# Patient Record
Sex: Female | Born: 1935 | Race: White | Hispanic: No | Smoking: Never smoker
Health system: Southern US, Community
[De-identification: ages and names within clinical notes are randomized; demographics above are authoritative.]

## PROBLEM LIST (undated history)

## (undated) DIAGNOSIS — E785 Hyperlipidemia, unspecified: Secondary | ICD-10-CM

## (undated) DIAGNOSIS — K219 Gastro-esophageal reflux disease without esophagitis: Secondary | ICD-10-CM

## (undated) DIAGNOSIS — I10 Essential (primary) hypertension: Secondary | ICD-10-CM

## (undated) DIAGNOSIS — K222 Esophageal obstruction: Secondary | ICD-10-CM

## (undated) HISTORY — DX: Essential (primary) hypertension: I10

## (undated) HISTORY — PX: COLONOSCOPY: SHX174

## (undated) HISTORY — DX: Gastro-esophageal reflux disease without esophagitis: K21.9

## (undated) HISTORY — DX: Esophageal obstruction: K22.2

## (undated) HISTORY — PX: POLYPECTOMY: SHX149

## (undated) HISTORY — PX: VAGINAL HYSTERECTOMY: SUR661

## (undated) HISTORY — DX: Hyperlipidemia, unspecified: E78.5

---

## 2005-05-01 ENCOUNTER — Ambulatory Visit: Payer: Self-pay | Admitting: Internal Medicine

## 2005-05-15 ENCOUNTER — Ambulatory Visit: Payer: Self-pay | Admitting: Internal Medicine

## 2011-04-18 ENCOUNTER — Ambulatory Visit (AMBULATORY_SURGERY_CENTER): Payer: Medicare Other | Admitting: *Deleted

## 2011-04-18 VITALS — Ht 66.0 in | Wt 144.0 lb

## 2011-04-18 DIAGNOSIS — Z860100 Personal history of colon polyps, unspecified: Secondary | ICD-10-CM

## 2011-04-18 DIAGNOSIS — Z1211 Encounter for screening for malignant neoplasm of colon: Secondary | ICD-10-CM

## 2011-04-18 MED ORDER — SUPREP BOWEL PREP KIT 17.5-3.13-1.6 GM/177ML PO SOLN: 1.0000 | ORAL | Status: DC

## 2011-04-24 ENCOUNTER — Telehealth: Payer: Self-pay | Admitting: Internal Medicine

## 2011-04-24 NOTE — Telephone Encounter (Signed)
Rx for Suprep called in to [facility] on [address]  Pt notified. Ezra Sites

## 2011-05-09 ENCOUNTER — Ambulatory Visit (AMBULATORY_SURGERY_CENTER): Payer: Medicare Other | Admitting: Internal Medicine

## 2011-05-09 ENCOUNTER — Encounter: Payer: Self-pay | Admitting: Internal Medicine

## 2011-05-09 VITALS — BP 127/67 | HR 72 | Temp 98.1°F | Resp 18 | Ht 66.0 in | Wt 144.0 lb

## 2011-05-09 DIAGNOSIS — Z860100 Personal history of colon polyps, unspecified: Secondary | ICD-10-CM

## 2011-05-09 DIAGNOSIS — Z1211 Encounter for screening for malignant neoplasm of colon: Secondary | ICD-10-CM

## 2011-05-09 MED ORDER — SODIUM CHLORIDE 0.9 % IV SOLN: 500.0000 mL | INTRAVENOUS | Status: DC

## 2011-05-09 NOTE — Patient Instructions (Signed)
Discharge instructions given with verbal understanding. Normal examination. Resume previous medications. 

## 2011-05-10 ENCOUNTER — Telehealth: Payer: Self-pay | Admitting: *Deleted

## 2011-05-10 NOTE — Telephone Encounter (Signed)

## 2014-05-18 ENCOUNTER — Telehealth: Payer: Self-pay

## 2014-05-18 NOTE — Telephone Encounter (Signed)
Pt scheduled for previsit 06/08/14@1 :30pm, EGD with dil scheduled in the [facility] 06/17/14@4pm . Pt aware of appts.

## 2014-05-18 NOTE — Telephone Encounter (Signed)
Message copied by Chrystie NoseHUNT, Evelyn Moch R on Tue May 18, 2014  3:18 PM ------      Message from: Hilarie FredricksonPERRY, JOHN N      Created: Tue May 18, 2014  1:34 PM      Regarding: [facility]       Emry Barbato,            I spoke with Tonya Fischer last evening regarding a several year history of intermittent solid food dysphagia. She is my patient. I am good friends of her sons, both physicians. Please set her up for EGD with dilation in [facility] as we discussed. Her home number is [phone]. Cell phone [phone]. Thanks            Dr. Marina GoodellPerry ------

## 2014-06-07 ENCOUNTER — Ambulatory Visit (AMBULATORY_SURGERY_CENTER): Payer: Self-pay | Admitting: *Deleted

## 2014-06-07 VITALS — Ht 64.0 in | Wt 143.5 lb

## 2014-06-07 DIAGNOSIS — R1310 Dysphagia, unspecified: Secondary | ICD-10-CM

## 2014-06-07 NOTE — Progress Notes (Signed)
No egg or soy allergy. ewm No home 02 use. ewm No diet pills. ewm No problems with past sedation. ewm  

## 2014-06-17 ENCOUNTER — Encounter: Payer: Self-pay | Admitting: *Deleted

## 2014-06-17 ENCOUNTER — Encounter: Payer: Self-pay | Admitting: Internal Medicine

## 2014-06-17 ENCOUNTER — Ambulatory Visit (AMBULATORY_SURGERY_CENTER): Payer: Medicare Other | Admitting: Internal Medicine

## 2014-06-17 VITALS — BP 123/74 | HR 73 | Temp 97.2°F | Resp 16 | Ht 64.0 in | Wt 143.0 lb

## 2014-06-17 DIAGNOSIS — R1314 Dysphagia, pharyngoesophageal phase: Secondary | ICD-10-CM

## 2014-06-17 DIAGNOSIS — K2100 Gastro-esophageal reflux disease with esophagitis, without bleeding: Secondary | ICD-10-CM

## 2014-06-17 DIAGNOSIS — K222 Esophageal obstruction: Secondary | ICD-10-CM

## 2014-06-17 MED ORDER — OMEPRAZOLE 20 MG PO CPDR: DELAYED_RELEASE_CAPSULE | ORAL | Status: DC

## 2014-06-17 MED ORDER — SODIUM CHLORIDE 0.9 % IV SOLN: 500.0000 mL | INTRAVENOUS | Status: DC

## 2014-06-17 NOTE — Progress Notes (Signed)
Called to room to assist during endoscopic procedure.  Patient ID and intended procedure confirmed with present staff. Received instructions for my participation in the procedure from the performing physician.  

## 2014-06-17 NOTE — Patient Instructions (Signed)
Discharge instructions given. Handout on a dilatation diet. Prescription sent in to pharmacy. Call office next 2-3 days to schedule an office appointment with Dr. Marina GoodellPerry for 6-8 weeks. YOU HAD AN ENDOSCOPIC PROCEDURE TODAY AT THE [facility]: Refer to the procedure report that was given to you for any specific questions about what was found during the examination.  If the procedure report does not answer your questions, please call your gastroenterologist to clarify.  If you requested that your care partner not be given the details of your procedure findings, then the procedure report has been included in a sealed envelope for you to review at your convenience later.  YOU SHOULD EXPECT: Some feelings of bloating in the abdomen. Passage of more gas than usual.  Walking can help get rid of the air that was put into your GI tract during the procedure and reduce the bloating. If you had a lower endoscopy (such as a colonoscopy or flexible sigmoidoscopy) you may notice spotting of blood in your stool or on the toilet paper. If you underwent a bowel prep for your procedure, then you may not have a normal bowel movement for a few days.  DIET: Your first meal following the procedure should be a light meal and then it is ok to progress to your normal diet.  A half-sandwich or bowl of soup is an example of a good first meal.  Heavy or fried foods are harder to digest and may make you feel nauseous or bloated.  Likewise meals heavy in dairy and vegetables can cause extra gas to form and this can also increase the bloating.  Drink plenty of fluids but you should avoid alcoholic beverages for 24 hours.  ACTIVITY: Your care partner should take you home directly after the procedure.  You should plan to take it easy, moving slowly for the rest of the day.  You can resume normal activity the day after the procedure however you should NOT DRIVE or use heavy machinery for 24 hours (because of the sedation  medicines used during the test).    SYMPTOMS TO REPORT IMMEDIATELY: A gastroenterologist can be reached at any hour.  During normal business hours, 8:30 AM to 5:00 PM Monday through Friday, call [phone].  After hours and on weekends, please call the GI answering service at [phone] who will take a message and have the physician on call contact you.    Following upper endoscopy (EGD)  Vomiting of blood or coffee ground material  New chest pain or pain under the shoulder blades  Painful or persistently difficult swallowing  New shortness of breath  Fever of 100F or higher  Black, tarry-looking stools  FOLLOW UP: If any biopsies were taken you will be contacted by phone or by letter within the next 1-3 weeks.  Call your gastroenterologist if you have not heard about the biopsies in 3 weeks.  Our staff will call the home number listed on your records the next business day following your procedure to check on you and address any questions or concerns that you may have at that time regarding the information given to you following your procedure. This is a courtesy call and so if there is no answer at the home number and we have not heard from you through the emergency physician on call, we will assume that you have returned to your regular daily activities without incident.  SIGNATURES/CONFIDENTIALITY: You and/or your care partner have signed paperwork which will be entered into  your electronic medical record.  These signatures attest to the fact that that the information above on your After Visit Summary has been reviewed and is understood.  Full responsibility of the confidentiality of this discharge information lies with you and/or your care-partner.

## 2014-06-17 NOTE — Progress Notes (Signed)
Report to PACU, RN, vss, BBS= Clear.  

## 2014-06-17 NOTE — Op Note (Signed)
[facility] [address] [place], [id number]   55ENDOSCOPY PROCEDURE REPORT  PATIENT: [name], Tonya F  MR#: [id number] BIRTHDATE: 16-Aug-1935 , 79  yrs. old GENDER: female ENDOSCOPIST: Roxy CedarJohn N Perry Jr, MD REFERRED BY:  .Direct Self PROCEDURE DATE:  06/17/2014 PROCEDURE:  EGD, diagnostic and Maloney dilation of esophagus   ?"552 [place] ASA CLASS:     Class II INDICATIONS:  dysphagia. - intermittent solid food dysphagia MEDICATIONS: Monitored anesthesia care and Propofol 160 mg IV TOPICAL ANESTHETIC: Cetacaine Spray  DESCRIPTION OF PROCEDURE: After the risks benefits and alternatives of the procedure were thoroughly explained, informed consent was obtained.  The LB [id number] endoscope was introduced through the mouth and advanced to the second portion of the duodenum , Without limitations.  The instrument was slowly withdrawn as the mucosa was fully examined.   EXAM:The esophagus revealed a significant ringlike peptic stricture with surrounding shelf at the gastroesophageal junction (37 cm from the teeth) measuring approximately 10 mm.  There was mild inflammation present.  No Barrett's esophagus.  The stomach revealed a 4 cm sliding hiatal hernia but was otherwise normal. The duodenum was normal to the second portion.  Retroflexed views revealed a hiatal hernia.     The scope was then withdrawn from the patient and the procedure completed. THERAPY: 352 French Maloney dilator was passed into the esophagus without resistance or heme. Tolerated well  COMPLICATIONS: There were no immediate complications.  ENDOSCOPIC IMPRESSION: 1. GERD complicated by peptic stricture status post [facility] dilation?"8452 [place]  RECOMMENDATIONS: 1.  Clear liquids until 6 PM, then soft foods rest of day.  Resume prior diet tomorrow. 2.  Prescribe omeprazole 20 mg daily; #30; 11 refills 3.  Call office next 2-3 days to schedule an office appointment with Dr. Marina GoodellPerry for 6-8  weeks  REPEAT EXAM:  eSigned:  Roxy CedarJohn N Perry Jr, MD 06/17/2014 4:00 PM    FA:OZHYQCC:Edwin Chilton SiGreen, MD and The Patient

## 2014-06-18 ENCOUNTER — Telehealth: Payer: Self-pay | Admitting: *Deleted

## 2014-06-18 NOTE — Telephone Encounter (Signed)
  Follow up Call-  Call back number 06/17/2014  Post procedure Call Back phone  # [phone]  Permission to leave phone message Yes     Patient questions:  Do you have a fever, pain , or abdominal swelling? No. Pain Score  0 *  Have you tolerated food without any problems? Yes.    Have you been able to return to your normal activities? Yes.    Do you have any questions about your discharge instructions: Diet   No. Medications  No. Follow up visit  No.  Do you have questions or concerns about your Care? No.  Actions: * If pain score is 4 or above: No action needed, pain <4.

## 2014-08-23 ENCOUNTER — Encounter: Payer: Self-pay | Admitting: Internal Medicine

## 2014-08-23 ENCOUNTER — Ambulatory Visit (INDEPENDENT_AMBULATORY_CARE_PROVIDER_SITE_OTHER): Payer: Medicare Other | Admitting: Internal Medicine

## 2014-08-23 VITALS — BP 120/74 | HR 80 | Ht 64.0 in | Wt 148.6 lb

## 2014-08-23 DIAGNOSIS — K222 Esophageal obstruction: Secondary | ICD-10-CM

## 2014-08-23 DIAGNOSIS — K219 Gastro-esophageal reflux disease without esophagitis: Secondary | ICD-10-CM

## 2014-08-23 DIAGNOSIS — R1310 Dysphagia, unspecified: Secondary | ICD-10-CM

## 2014-08-23 NOTE — Progress Notes (Signed)
HISTORY OF PRESENT ILLNESS:  Tonya Fischer is a 80 y.o. female with hypertension, hyperlipidemia, and chronic GERD. She was recently evaluated for intermittent solid food dysphagia and underwent upper endoscopy 06/17/2014. She was found to have a peptic stricture of the distal esophagus which was dilated to 83 [place]. Also, a sliding hiatal hernia. Post dilation she was prescribed omeprazole 20 mg daily. She presents today for follow-up. Since her endoscopy she has had absolutely no complaints. No reflux symptoms. No recurrent dysphagia. No appreciable medication side effect  REVIEW OF SYSTEMS:  All non-GI ROS negative except for rash on lower extremities  Past Medical History  Diagnosis Date  . Hypertension   . Hyperlipidemia   . GERD (gastroesophageal reflux disease)   . Peptic stricture of esophagus     Past Surgical History  Procedure Laterality Date  . Vaginal hysterectomy      1984  . Colonoscopy    . Polypectomy      Social History ELYZABETH Fischer  reports that she has never smoked. She has never used smokeless tobacco. She reports that she drinks alcohol. She reports that she does not use illicit drugs.  family history includes Stomach cancer in her paternal uncle; Stroke in her mother. There is no history of Colon cancer or Esophageal cancer.  No Known Allergies     PHYSICAL EXAMINATION: Vital signs: BP 120/74 mmHg  Pulse 80  Ht  (1.626 m)  Wt 148 lb 9.6 oz (67.405 kg)  BMI 25.49 kg/m2 General: Well-developed, well-nourished, no acute distress Abdomen: Not reexamined Extremities: No edema. Flat erythematous diffuse rash on the shin surface bilaterally Psychiatric: alert and oriented x3. Cooperative   ASSESSMENT:  #1. GERD complicated by peptic stricture. Asymptomatic post dilation on PPI. I reviewed with her in detail the endoscopic findings, the pathophysiology, and treatment plan. As well, expectations moving forward regarding her condition #2. History of  adenomatous colon polyps. Last colonoscopy September 2012 without neoplasia.   PLAN:   #1. Reflux precautions #2. Continue PPI #3. Routine follow-up one year. Sooner if needed for recurrent symptoms or other issues

## 2014-08-23 NOTE — Patient Instructions (Signed)
Please follow up with Dr. Perry in one year 

## 2015-01-24 ENCOUNTER — Telehealth: Payer: Self-pay | Admitting: Internal Medicine

## 2015-01-24 NOTE — Telephone Encounter (Signed)
Discussed with pt that she can hold the citrucel if she is having diarrhea. Pt verbalized understanding.

## 2015-03-02 ENCOUNTER — Other Ambulatory Visit (HOSPITAL_COMMUNITY): Payer: Self-pay | Admitting: Internal Medicine

## 2015-03-02 DIAGNOSIS — I4891 Unspecified atrial fibrillation: Secondary | ICD-10-CM

## 2015-03-07 ENCOUNTER — Ambulatory Visit (HOSPITAL_COMMUNITY)
Admission: RE | Admit: 2015-03-07 | Discharge: 2015-03-07 | Disposition: A | Discharge status: Home / Self Care | Payer: Medicare Other | Source: Ambulatory Visit | Attending: Internal Medicine | Admitting: Internal Medicine

## 2015-03-07 DIAGNOSIS — I358 Other nonrheumatic aortic valve disorders: Secondary | ICD-10-CM | POA: Insufficient documentation

## 2015-03-07 DIAGNOSIS — I4891 Unspecified atrial fibrillation: Secondary | ICD-10-CM

## 2015-03-07 DIAGNOSIS — I071 Rheumatic tricuspid insufficiency: Secondary | ICD-10-CM | POA: Insufficient documentation

## 2015-03-07 NOTE — Progress Notes (Signed)
  Echocardiogram 2D Echocardiogram has been performed.  Arvil ChacoFoster, Jalesia Loudenslager 03/07/2015, 1:23 PM

## 2015-04-18 ENCOUNTER — Encounter: Payer: Self-pay | Admitting: Internal Medicine

## 2015-04-18 ENCOUNTER — Ambulatory Visit (INDEPENDENT_AMBULATORY_CARE_PROVIDER_SITE_OTHER): Payer: Medicare Other | Admitting: Internal Medicine

## 2015-04-18 VITALS — BP 118/72 | HR 80 | Ht 65.0 in | Wt 143.5 lb

## 2015-04-18 DIAGNOSIS — R002 Palpitations: Secondary | ICD-10-CM | POA: Diagnosis not present

## 2015-04-18 NOTE — Progress Notes (Addendum)
Cardiology Office Note   Date:  04/18/2015   ID:  Tonya Fischer, DOB Aug 12, 1935, MRN [id number]  PCP:  Tonya Sack, MD  Cardiologist:   Tonya Pates, MD   No chief complaint on file.  Pt referred for palpitations     History of Present Illness: Tonya Fischer is a 80 y.o. female with a history of HTN,  HL and  esophageal stricture   Pt followed by Dr Tonya Fischer  Recently seenin clinic  EKG was concerning to him  He reviewed with her   No palpitations  No fluttering  Breathing is good  No CP  Pt says she is  active    Current Outpatient Prescriptions  Medication Sig Dispense Refill  . amLODipine (NORVASC) 5 MG tablet Take 5 mg by mouth daily.      Marland Kitchen aspirin 81 MG tablet Take 81 mg by mouth daily. 1-2 daily    . losartan-hydrochlorothiazide (HYZAAR) 100-25 MG per tablet Take 1 tablet by mouth daily.      . Multiple Vitamins-Minerals (MULTIVITAMIN WITH MINERALS) tablet Take 1 tablet by mouth daily.      Marland Kitchen omeprazole (PRILOSEC) 20 MG capsule Take 1 tablet 30 minutes before breakfast 30 capsule 11  . simvastatin (ZOCOR) 10 MG tablet TK 1 T PO  D  0   No current facility-administered medications for this visit.    Allergies:   Review of patient's allergies indicates no known allergies.   Past Medical History  Diagnosis Date  . Hypertension   . Hyperlipidemia   . GERD (gastroesophageal reflux disease)   . Peptic stricture of esophagus     Past Surgical History  Procedure Laterality Date  . Vaginal hysterectomy      1984  . Colonoscopy    . Polypectomy       Social History:  The patient  reports that she has never smoked. She has never used smokeless tobacco. She reports that she drinks alcohol. She reports that she does not use illicit drugs.   Family History:  The patient's family history includes Stomach cancer in her paternal uncle; Stroke in her mother. There is no history of Colon cancer or Esophageal cancer.    ROS:  Please see the history of present illness.  All other systems are reviewed and  Negative to the above problem except as noted.    PHYSICAL EXAM: VS:  BP 118/72 mmHg  Pulse 80  Ht  (1.651 m)  Wt 143 lb 8 oz (65.091 kg)  BMI 23.88 kg/m2  GEN: Well nourished, well developed, in no acute distress HEENT: normal Neck: no JVD, carotid bruits, or masses Cardiac: RRR; no murmurs, rubs, or gallops,no edema  Respiratory:  clear to auscultation bilaterally, normal work of breathing GI: soft, nontender, nondistended, + BS  No hepatomegaly  MS: no deformity Moving all extremities   Skin: warm and dry, no rash Neuro:  Strength and sensation are intact Psych: euthymic mood, full affect   EKG:  EKG is ordered today. SR 80 bpm     Lipid Panel No results found for: CHOL, TRIG, HDL, CHOLHDL, VLDL, LDLCALC, LDLDIRECT    Wt Readings from Last 3 Encounters:  04/18/15 143 lb 8 oz (65.091 kg)  08/23/14 148 lb 9.6 oz (67.405 kg)  06/17/14 143 lb (64.864 kg)      ASSESSMENT AND PLAN:  1.  Palpitations.  Pt is asymptomatic  I do not have EKG from Dr Tonya Fischer office  Will request it.  HR is irreg at times today during exam  Tele strip with a few very short bursts PAT Will set up for 48 hour holter monitor . Keep on same medicines for now CHeck on TSH  2.  HL  Continue statin  3.  HTN  Adequate control     Signed, Tonya Pates, MD  04/18/2015 12:13 PM    [facility] [address] [place], [place], [place]  [address] Phone: [phone]; Fax: [phone]

## 2015-04-18 NOTE — Patient Instructions (Signed)
Your physician recommends that you schedule a follow-up appointment in: to be determined by Dr Tenny Craw after monitor.     Your physician recommends that you continue on your current medications as directed. Please refer to the Current Medication list given to you today.      Your physician has recommended that you wear an event monitor for 48 hrs. Event monitors are medical devices that record the heart's electrical activity. Doctors most often [place] these monitors to diagnose arrhythmias. Arrhythmias are problems with the speed or rhythm of the heartbeat. The monitor is a small, portable device. You can wear one while you do your normal daily activities. This is usually used to diagnose what is causing palpitations/syncope (passing out).     Thank you for choosing [facility] !

## 2015-04-22 ENCOUNTER — Ambulatory Visit (INDEPENDENT_AMBULATORY_CARE_PROVIDER_SITE_OTHER): Payer: Medicare Other

## 2015-04-22 DIAGNOSIS — R002 Palpitations: Secondary | ICD-10-CM

## 2015-04-27 ENCOUNTER — Telehealth: Payer: Self-pay | Admitting: *Deleted

## 2015-04-27 NOTE — Telephone Encounter (Signed)
I received called report from [facility] that pt had 2 episodes of VT on recent holter monitor, pt symptomatic.   Dr Patty Sermons reviewed complete Holter Monitor 48 hour done 04/22/15: 48 hour Holter monitor shows sinus rhythm. Occasional PVCs are seen.  Occasional paired PVCs and one run of nonsustained VT of 8 beats, asymptomatic. In addition there are occasional PACs and occassional short runs of SVT.  No atrial fibrillation in seen.    I spoke with pt- pt states she has not had any symptoms, specifically she did not have any symptoms while she had monitor.   The 48 hour Holter monitor report has been forwarded to Dr Tenny Craw for review.

## 2015-05-06 ENCOUNTER — Telehealth: Payer: Self-pay | Admitting: Internal Medicine

## 2015-05-06 NOTE — Telephone Encounter (Signed)
Left msg on voicemail that I called  I have reviewed holter monitor with EP   Short burst of tachycardia   Pt asymtpomatic   No atrial fibrillation noted  She did have echo done previously which was normal She was not having any symptoms done when I saw her in clinic   If she senses palpitations could try low dose b blocker (Toprol XL 25)  Othrewise keep on same meds   Would try to get EKG done from Internal med  Otherwise set to se in clinic after new year.

## 2015-05-08 NOTE — Telephone Encounter (Signed)
Spoke with pt  She called back Feeling good  No palpitations Continue current meds   Stay active   WIll f/u in Spring

## 2015-08-31 ENCOUNTER — Encounter: Payer: Self-pay | Admitting: Internal Medicine

## 2016-04-06 ENCOUNTER — Encounter: Payer: Self-pay | Admitting: Internal Medicine

## 2016-05-04 ENCOUNTER — Telehealth: Payer: Self-pay | Admitting: Internal Medicine

## 2016-05-04 NOTE — Telephone Encounter (Signed)
Pt called and states she did not have any polyps on her 2012 colonoscopy. Pt received her recall letter and does not want to schedule a repeat colon. Dr. Marina GoodellPerry notified.

## 2016-05-06 NOTE — Telephone Encounter (Signed)
She is correct (and the mom of physician friends of mine). She does NOT require routine follow up based on previous exams and current guidelines. Please let her know. Thanks

## 2016-05-07 NOTE — Telephone Encounter (Signed)
Pt aware.

## 2018-04-02 ENCOUNTER — Telehealth: Payer: Self-pay | Admitting: Internal Medicine

## 2018-04-02 NOTE — Telephone Encounter (Signed)
Pt calling with constipation and wanted to know what oral powder laxative Dr. Marina GoodellPerry suggests. Discussed with pt that he recommends OTC miralax. Pt verbalized that the stool was quite painful to pass also, suggested a stool softener for the hard stool. Pt verbalized understanding.

## 2018-04-02 NOTE — Telephone Encounter (Signed)
Pt wants to know what Dr. Marina GoodellPerry recommends for constipation, she was thinking about taking miralax but wants to check with him first.

## 2019-09-11 ENCOUNTER — Ambulatory Visit: Payer: Medicare Other | Attending: Internal Medicine

## 2019-09-11 DIAGNOSIS — Z23 Encounter for immunization: Secondary | ICD-10-CM | POA: Insufficient documentation

## 2019-09-11 NOTE — Progress Notes (Signed)
   Covid-19 Vaccination Clinic  Name:  Tonya Fischer    MRN: [id number] DOB: 12-02-1934  09/11/2019  [name] was observed post Covid-19 immunization for 15 minutes without incidence. She was provided with Vaccine Information Sheet and instruction to access the V-Safe system.   [name] was instructed to call 911 with any severe reactions post vaccine: Marland Kitchen Difficulty breathing  . Swelling of your face and throat  . A fast heartbeat  . A bad rash all over your body  . Dizziness and weakness    Immunizations Administered    Name Date Dose VIS Date Route   Pfizer COVID-19 Vaccine 09/11/2019  1:46 PM 0.3 mL 07/31/2019 Intramuscular   Manufacturer: [employer], [employer]   Lot: [id number]   NDC: [address]-3

## 2019-09-30 ENCOUNTER — Ambulatory Visit: Payer: Medicare Other | Attending: Internal Medicine

## 2019-09-30 DIAGNOSIS — Z23 Encounter for immunization: Secondary | ICD-10-CM | POA: Insufficient documentation

## 2019-09-30 NOTE — Progress Notes (Signed)
   Covid-19 Vaccination Clinic  Name:  Tonya Fischer    MRN: [id number] DOB: Oct 31, 1934  09/30/2019  [name] was observed post Covid-19 immunization for 15 minutes without incidence. She was provided with Vaccine Information Sheet and instruction to access the V-Safe system.   [name] was instructed to call 911 with any severe reactions post vaccine: Marland Kitchen Difficulty breathing  . Swelling of your face and throat  . A fast heartbeat  . A bad rash all over your body  . Dizziness and weakness    Immunizations Administered    Name Date Dose VIS Date Route   Pfizer COVID-19 Vaccine 09/30/2019  1:44 PM 0.3 mL 07/31/2019 Intramuscular   Manufacturer: [employer], [employer]   Lot: [id number]   NDC: [address]-0

## 2020-01-28 ENCOUNTER — Telehealth: Payer: Self-pay

## 2020-01-28 NOTE — Telephone Encounter (Signed)
Pt scheduled to see Dr. Marina Goodell 02/02/20@2pm . Left message for pt to call back.

## 2020-01-28 NOTE — Telephone Encounter (Signed)
Pt returned your call and was informed of appt with Dr. Marina Goodell. She agreed with date and time.

## 2020-01-28 NOTE — Telephone Encounter (Signed)
-----   Message from Hilarie Fredrickson, MD sent at 01/28/2020 10:16 AM EDT ----- Regarding: Appointment Tuesday, June 15 at 2 PM Jozlyn Schatz,Please arrange an appointment for Mrs. Baltes this coming Tuesday, June 15 at 2 PM.  Please call her and let her know.  Thank youJ.

## 2020-02-02 ENCOUNTER — Ambulatory Visit (INDEPENDENT_AMBULATORY_CARE_PROVIDER_SITE_OTHER): Payer: Medicare Other | Admitting: Internal Medicine

## 2020-02-02 ENCOUNTER — Encounter: Payer: Self-pay | Admitting: Internal Medicine

## 2020-02-02 ENCOUNTER — Other Ambulatory Visit (INDEPENDENT_AMBULATORY_CARE_PROVIDER_SITE_OTHER): Payer: Medicare Other

## 2020-02-02 ENCOUNTER — Other Ambulatory Visit: Payer: Self-pay

## 2020-02-02 VITALS — BP 120/70 | HR 84 | Ht 65.0 in | Wt 130.0 lb

## 2020-02-02 DIAGNOSIS — R198 Other specified symptoms and signs involving the digestive system and abdomen: Secondary | ICD-10-CM

## 2020-02-02 DIAGNOSIS — K589 Irritable bowel syndrome without diarrhea: Secondary | ICD-10-CM

## 2020-02-02 DIAGNOSIS — K219 Gastro-esophageal reflux disease without esophagitis: Secondary | ICD-10-CM | POA: Diagnosis not present

## 2020-02-02 DIAGNOSIS — K222 Esophageal obstruction: Secondary | ICD-10-CM

## 2020-02-02 LAB — HEPATIC FUNCTION PANEL
ALT: 20 U/L (ref 0–35)
AST: 25 U/L (ref 0–37)
Albumin: 4.6 g/dL (ref 3.5–5.2)
Alkaline Phosphatase: 120 U/L — ABNORMAL HIGH (ref 39–117)
Bilirubin, Direct: 0.1 mg/dL (ref 0.0–0.3)
Total Bilirubin: 0.6 mg/dL (ref 0.2–1.2)
Total Protein: 8.3 g/dL (ref 6.0–8.3)

## 2020-02-02 LAB — BASIC METABOLIC PANEL
BUN: 7 mg/dL (ref 6–23)
CO2: 29 mEq/L (ref 19–32)
Calcium: 10.1 mg/dL (ref 8.4–10.5)
Chloride: 94 mEq/L — ABNORMAL LOW (ref 96–112)
Creatinine, Ser: 0.67 mg/dL (ref 0.40–1.20)
GFR: 84.05 mL/min (ref >60.00)
Glucose, Bld: 106 mg/dL — ABNORMAL HIGH (ref 70–99)
Potassium: 3.5 mEq/L (ref 3.5–5.1)
Sodium: 129 mEq/L — ABNORMAL LOW (ref 135–145)

## 2020-02-02 LAB — CBC WITH DIFFERENTIAL/PLATELET
Basophils Absolute: 0.1 10*3/uL (ref 0.0–0.1)
Basophils Relative: 0.6 % (ref 0.0–3.0)
Eosinophils Absolute: 0 10*3/uL (ref 0.0–0.7)
Eosinophils Relative: 0.2 % (ref 0.0–5.0)
HCT: 37.7 % (ref 36.0–46.0)
Hemoglobin: 12.6 g/dL (ref 12.0–15.0)
Lymphocytes Relative: 37.3 % (ref 12.0–46.0)
Lymphs Abs: 3.3 10*3/uL (ref 0.7–4.0)
MCHC: 33.5 g/dL (ref 30.0–36.0)
MCV: 82.1 fl (ref 78.0–100.0)
Monocytes Absolute: 0.9 10*3/uL (ref 0.1–1.0)
Monocytes Relative: 9.7 % (ref 3.0–12.0)
Neutro Abs: 4.7 10*3/uL (ref 1.4–7.7)
Neutrophils Relative %: 52.2 % (ref 43.0–77.0)
Platelets: 271 10*3/uL (ref 150.0–400.0)
RBC: 4.59 Mil/uL (ref 3.87–5.11)
RDW: 13.4 % (ref 11.5–15.5)
WBC: 9 10*3/uL (ref 4.0–10.5)

## 2020-02-02 LAB — TSH: TSH: 0.99 u[IU]/mL (ref 0.35–4.50)

## 2020-02-02 NOTE — Patient Instructions (Signed)
Your provider has requested that you go to the basement level for lab work before leaving today. Press "B" on the elevator. The lab is located at the first door on the left as you exit the elevator.    Follow the instructions on the Hemoccult cards and mail them back to [place] when you are finished or you may take them directly to the lab in the basement of the [facility] building. We will call you with the results.

## 2020-02-02 NOTE — Progress Notes (Signed)
HISTORY OF PRESENT ILLNESS:  Tonya Fischer is a 85 y.o. female, mother of Drs. Tonya Fischer and Tonya Fischer, with hypertension, hyperlipidemia, and GERD complicated by peptic stricture for which she underwent upper endoscopy with esophageal dilation in October 2016.  She presents today for evaluation of chronic intermittent problems with irregular bowel habits associated with decreased energy times.  She was last seen in this office January 2016.  Her primary care physician, Tonya Fischer, has retired.  Patient is planning on establishing with Tonya Fischer later this year.  She is contacted intermittently mildly regularities.  More recently complaining of being "less chipper" with decreased energy associated with frequent bowel movements.  She has had approximately 8 pound weight loss since last year.  Tells me that she takes 2 tablespoons of Citrucel daily as well as stool softeners.  With this regimen she has a bowel movement once every 2 to 3 days where 2 or 3 bowel movements per day.  She denies incontinence, abdominal discomfort, nausea, or bleeding.  She denies bloating.  She will notice that when she has several bowel movements (which are not diarrheal) that she may feel less energetic thereafter.  She is often tempted to take a half a dose of Imodium.  She has had multiple previous colonoscopies. Examination 2003 with diminutive adenoma. Examination of 2006 was negative for neoplasia.    Examination 2012 was normal and negative for neoplasia.  She has aged out of surveillance.  Also has a history of GERD and peptic stricture.  Currently not on PPI therapy.  She has had no recurrent dysphagia since her remote dilation.  She is not requiring PPI therapy she is asymptomatic.  Last upper endoscopy with dilation was October 2015 she did bring with her blood work from her office evaluation April 09, 2019.  Hemoglobin 13.3.  Liver tests normal.  No family history of colon cancer.  Her sister passed away from ovarian  cancer.  Patient status post vaginal hysterectomy.  She has been vaccinated against Covid.  REVIEW OF SYSTEMS:  All non-GI ROS negative unless otherwise stated in the HPI.  Past Medical History:  Diagnosis Date  . GERD (gastroesophageal reflux disease)   . Hyperlipidemia   . Hypertension   . Peptic stricture of esophagus     Past Surgical History:  Procedure Laterality Date  . COLONOSCOPY    . POLYPECTOMY    . VAGINAL HYSTERECTOMY     1984    Social History Tonya Fischer  reports that she has never smoked. She has never used smokeless tobacco. She reports current alcohol use. She reports that she does not use drugs.  family history includes Stomach cancer in her paternal uncle; Stroke in her mother.  No Known Allergies     PHYSICAL EXAMINATION: Vital signs: BP 120/70   Pulse 84   Ht 5\' 5"  (1.651 m)   Wt 130 lb (59 kg)   BMI 21.63 kg/m   Constitutional: generally well-appearing, no acute distress Psychiatric: alert and oriented x3, cooperative Eyes: extraocular movements intact, anicteric, conjunctiva pink Mouth: oral pharynx moist, no lesions Neck: supple no lymphadenopathy Cardiovascular: heart regular rate and rhythm, no murmur Lungs: clear to auscultation bilaterally Abdomen: soft, nontender, nondistended, no obvious ascites, no peritoneal signs, normal bowel sounds, no organomegaly Rectal: Omitted Extremities: no clubbing, cyanosis, or lower extremity edema bilaterally Skin: no lesions on visible extremities Neuro: No focal deficits.  Cranial nerves intact  ASSESSMENT:  1.  Chronic stable by regularities as described.  2.  Decreased energy associated with increased bowel movements.  Nonspecific 3.  Weight loss.  Presumably due to decreased appetite, per patient. 4.  Multiple prior colonoscopies with diminutive adenoma on one examination with the other examinations normal.  Most recent 2012 5.  History of GERD and peptic stricture status post dilation 2015.   Currently asymptomatic post dilation on no reflux therapy.  PLAN:  1.  Continue Citrucel daily 2.  Decrease Citrucel if increased frequency of bowel movements problematic 3.  Avoid Imodium 4.  Blood work today including CBC, comprehensive metabolic panel, TSH to evaluate fatigue and weight loss. 5.  Stool Hemoccult studies 6.  We will contact patient after the above results available. 7.  GI follow-up as needed

## 2020-02-09 ENCOUNTER — Telehealth: Payer: Self-pay

## 2020-02-09 NOTE — Telephone Encounter (Signed)
Spoke with patient regarding lab reminder, pt states that she will be in tomorrow morning to have labs drawn.

## 2020-02-09 NOTE — Telephone Encounter (Signed)
-----   Message from Missy Sabins, RN sent at 02/02/2020  4:38 PM EDT ----- Repeat BMP, order in epic

## 2020-02-10 ENCOUNTER — Other Ambulatory Visit: Payer: Self-pay

## 2020-02-10 ENCOUNTER — Other Ambulatory Visit (INDEPENDENT_AMBULATORY_CARE_PROVIDER_SITE_OTHER): Payer: Medicare Other

## 2020-02-10 DIAGNOSIS — K589 Irritable bowel syndrome without diarrhea: Secondary | ICD-10-CM

## 2020-02-10 DIAGNOSIS — E871 Hypo-osmolality and hyponatremia: Secondary | ICD-10-CM

## 2020-02-10 LAB — HEMOCCULT SLIDES (X 3 CARDS)
Fecal Occult Blood: NEGATIVE
OCCULT 1: NEGATIVE
OCCULT 2: NEGATIVE
OCCULT 3: NEGATIVE
OCCULT 4: NEGATIVE
OCCULT 5: NEGATIVE

## 2020-02-10 LAB — BASIC METABOLIC PANEL
BUN: 7 mg/dL (ref 6–23)
CO2: 27 mEq/L (ref 19–32)
Calcium: 9.9 mg/dL (ref 8.4–10.5)
Chloride: 99 mEq/L (ref 96–112)
Creatinine, Ser: 0.72 mg/dL (ref 0.40–1.20)
GFR: 77.35 mL/min (ref >60.00)
Glucose, Bld: 101 mg/dL — ABNORMAL HIGH (ref 70–99)
Potassium: 3.7 mEq/L (ref 3.5–5.1)
Sodium: 132 mEq/L — ABNORMAL LOW (ref 135–145)

## 2020-02-17 ENCOUNTER — Other Ambulatory Visit (INDEPENDENT_AMBULATORY_CARE_PROVIDER_SITE_OTHER): Payer: Medicare Other

## 2020-02-17 DIAGNOSIS — E871 Hypo-osmolality and hyponatremia: Secondary | ICD-10-CM | POA: Diagnosis not present

## 2020-02-17 LAB — BASIC METABOLIC PANEL
BUN: 6 mg/dL (ref 6–23)
CO2: 28 mEq/L (ref 19–32)
Calcium: 9.7 mg/dL (ref 8.4–10.5)
Chloride: 101 mEq/L (ref 96–112)
Creatinine, Ser: 0.66 mg/dL (ref 0.40–1.20)
GFR: 85.51 mL/min (ref >60.00)
Glucose, Bld: 101 mg/dL — ABNORMAL HIGH (ref 70–99)
Potassium: 3.9 mEq/L (ref 3.5–5.1)
Sodium: 137 mEq/L (ref 135–145)

## 2020-05-24 ENCOUNTER — Other Ambulatory Visit: Payer: Self-pay

## 2020-05-24 MED ORDER — AMLODIPINE BESYLATE 5 MG PO TABS: 5.0000 mg | ORAL_TABLET | Freq: Every day | ORAL | 1 refills | Status: DC

## 2020-11-17 ENCOUNTER — Other Ambulatory Visit: Payer: Self-pay | Admitting: Internal Medicine

## 2021-01-24 ENCOUNTER — Telehealth: Payer: Self-pay

## 2021-01-24 ENCOUNTER — Other Ambulatory Visit: Payer: Self-pay

## 2021-01-24 DIAGNOSIS — R634 Abnormal weight loss: Secondary | ICD-10-CM

## 2021-01-24 DIAGNOSIS — T733XXA Exhaustion due to excessive exertion, initial encounter: Secondary | ICD-10-CM

## 2021-01-24 DIAGNOSIS — R109 Unspecified abdominal pain: Secondary | ICD-10-CM

## 2021-01-24 NOTE — Telephone Encounter (Signed)
-----  Message from Irene Shipper, MD sent at 01/24/2021 11:31 AM EDT ----- Regarding: needs labs, CT I received call from patient's son (Dr. Wende Mott). Mother is having weakness, decreased exercise tolerance and 10 lb weight loss in 6 months. Please contact the patient and arrange the following, ASAP:  1. CBC, CMET, ESR, CRP, TSH 2. Contrast CT chest, abdomen, pelvis " weight loss, exertional fatigue, abdominal discomfort".  Thanks,  JP  Convert to phone note

## 2021-01-24 NOTE — Telephone Encounter (Signed)
Lab orders in epic, CT of CAP scheduled at [facility] 01/31/21@4 :30pm. Pt to pick up contrast prior to exam at [facility]. Pt to be NPO after 12:30pm, drink bottle 1 of contrast at 2:30pm and bottle 2 at 3:30pm. Left message for pt to call back.  Spoke with pt and she is aware of appt and knows to come for labs.

## 2021-01-26 ENCOUNTER — Other Ambulatory Visit (INDEPENDENT_AMBULATORY_CARE_PROVIDER_SITE_OTHER): Payer: Medicare Other

## 2021-01-26 DIAGNOSIS — R109 Unspecified abdominal pain: Secondary | ICD-10-CM | POA: Diagnosis not present

## 2021-01-26 DIAGNOSIS — T733XXA Exhaustion due to excessive exertion, initial encounter: Secondary | ICD-10-CM | POA: Diagnosis not present

## 2021-01-26 DIAGNOSIS — R634 Abnormal weight loss: Secondary | ICD-10-CM | POA: Diagnosis not present

## 2021-01-26 LAB — COMPREHENSIVE METABOLIC PANEL
ALT: 20 U/L (ref 0–35)
AST: 23 U/L (ref 0–37)
Albumin: 4.2 g/dL (ref 3.5–5.2)
Alkaline Phosphatase: 99 U/L (ref 39–117)
BUN: 12 mg/dL (ref 6–23)
CO2: 29 mEq/L (ref 19–32)
Calcium: 9.6 mg/dL (ref 8.4–10.5)
Chloride: 99 mEq/L (ref 96–112)
Creatinine, Ser: 0.79 mg/dL (ref 0.40–1.20)
GFR: 68.88 mL/min (ref >60.00)
Glucose, Bld: 110 mg/dL — ABNORMAL HIGH (ref 70–99)
Potassium: 3.4 mEq/L — ABNORMAL LOW (ref 3.5–5.1)
Sodium: 137 mEq/L (ref 135–145)
Total Bilirubin: 0.5 mg/dL (ref 0.2–1.2)
Total Protein: 7.5 g/dL (ref 6.0–8.3)

## 2021-01-26 LAB — CBC WITH DIFFERENTIAL/PLATELET
Basophils Absolute: 0.1 10*3/uL (ref 0.0–0.1)
Basophils Relative: 0.8 % (ref 0.0–3.0)
Eosinophils Absolute: 0 10*3/uL (ref 0.0–0.7)
Eosinophils Relative: 0.2 % (ref 0.0–5.0)
HCT: 38.4 % (ref 36.0–46.0)
Hemoglobin: 12.8 g/dL (ref 12.0–15.0)
Lymphocytes Relative: 30.9 % (ref 12.0–46.0)
Lymphs Abs: 2.9 10*3/uL (ref 0.7–4.0)
MCHC: 33.5 g/dL (ref 30.0–36.0)
MCV: 83.3 fl (ref 78.0–100.0)
Monocytes Absolute: 0.7 10*3/uL (ref 0.1–1.0)
Monocytes Relative: 7.8 % (ref 3.0–12.0)
Neutro Abs: 5.6 10*3/uL (ref 1.4–7.7)
Neutrophils Relative %: 60.3 % (ref 43.0–77.0)
Platelets: 280 10*3/uL (ref 150.0–400.0)
RBC: 4.61 Mil/uL (ref 3.87–5.11)
RDW: 14.1 % (ref 11.5–15.5)
WBC: 9.3 10*3/uL (ref 4.0–10.5)

## 2021-01-26 LAB — TSH: TSH: 0.51 u[IU]/mL (ref 0.35–4.50)

## 2021-01-26 LAB — C-REACTIVE PROTEIN: CRP: <1 mg/dL (ref 0.5–20.0)

## 2021-01-26 LAB — SEDIMENTATION RATE: Sed Rate: 32 mm/hr — ABNORMAL HIGH (ref 0–30)

## 2021-01-31 ENCOUNTER — Other Ambulatory Visit: Payer: Self-pay

## 2021-01-31 ENCOUNTER — Ambulatory Visit (HOSPITAL_COMMUNITY)
Admission: RE | Admit: 2021-01-31 | Discharge: 2021-01-31 | Disposition: A | Discharge status: Home / Self Care | Payer: Medicare Other | Source: Ambulatory Visit | Attending: Internal Medicine | Admitting: Internal Medicine

## 2021-01-31 DIAGNOSIS — R109 Unspecified abdominal pain: Secondary | ICD-10-CM | POA: Insufficient documentation

## 2021-01-31 DIAGNOSIS — T733XXA Exhaustion due to excessive exertion, initial encounter: Secondary | ICD-10-CM | POA: Diagnosis present

## 2021-01-31 DIAGNOSIS — R634 Abnormal weight loss: Secondary | ICD-10-CM | POA: Diagnosis present

## 2021-01-31 MED ORDER — SODIUM CHLORIDE (PF) 0.9 % IJ SOLN
INTRAMUSCULAR | Status: AC
  Filled 2021-01-31: qty 50

## 2021-01-31 MED ORDER — IOHEXOL 300 MG/ML  SOLN
100.0000 mL | Freq: Once | INTRAMUSCULAR | Status: AC | PRN
  Administered 2021-01-31: 100 mL via INTRAVENOUS

## 2021-02-01 ENCOUNTER — Telehealth: Payer: Self-pay | Admitting: Internal Medicine

## 2021-02-01 NOTE — Telephone Encounter (Signed)
Received call report from radiology regarding pts CT scan:  IMPRESSION: 1. Large heterogeneous 7.4 cm mass arising from the left lobe of the thyroid which extends into the mediastinum effacing and compressing the trachea, suspicious for primary thyroid malignancy. Suggest further evaluation with ultrasound-guided direct tissue sampling. 2. Small left upper lobe pulmonary nodules measuring up to 3 mm, nonspecific. Recommend attention on close interval follow-up imaging. 3. Well-circumscribed 1.7 cm segment II hepatic lesion demonstrating Hounsfield units slightly greater than that expected for a simple cyst. While this may represent a proteinaceous or hemorrhagic cyst, further evaluation with liver protocol MRI with and without contrast is suggested. 4. Hypodense bilobar hepatic lesions which measure 5 mm or smaller, which are too technically small to accurately characterize. These would could be further evaluated with abdominal MRI. 5. Aortic atherosclerosis.  Aortic Atherosclerosis (ICD10-I70.0

## 2021-02-01 NOTE — Telephone Encounter (Signed)
Inbound call from [facility] Radiology with CT results for patient. Best contact number [phone]

## 2021-02-01 NOTE — Telephone Encounter (Signed)
I did see the report.  I am reaching out to the patient, her PCP, and her son.  Thank you

## 2021-02-08 ENCOUNTER — Other Ambulatory Visit: Payer: Self-pay | Admitting: Surgery

## 2021-02-08 DIAGNOSIS — E079 Disorder of thyroid, unspecified: Secondary | ICD-10-CM

## 2021-02-14 ENCOUNTER — Ambulatory Visit
Admission: RE | Admit: 2021-02-14 | Discharge: 2021-02-14 | Disposition: A | Discharge status: Home / Self Care | Payer: Medicare Other | Source: Ambulatory Visit | Attending: Surgery | Admitting: Surgery

## 2021-02-14 ENCOUNTER — Other Ambulatory Visit: Payer: Self-pay

## 2021-02-14 ENCOUNTER — Other Ambulatory Visit (HOSPITAL_COMMUNITY)
Admission: RE | Admit: 2021-02-14 | Discharge: 2021-02-14 | Disposition: A | Discharge status: Home / Self Care | Payer: Medicare Other | Source: Ambulatory Visit | Attending: Surgery | Admitting: Surgery

## 2021-02-14 DIAGNOSIS — E079 Disorder of thyroid, unspecified: Secondary | ICD-10-CM

## 2021-02-22 LAB — CYTOLOGY - NON PAP

## 2021-03-08 ENCOUNTER — Encounter: Payer: Self-pay | Admitting: Surgery

## 2021-03-08 ENCOUNTER — Encounter (HOSPITAL_COMMUNITY): Payer: Self-pay

## 2021-03-08 ENCOUNTER — Telehealth: Payer: Self-pay | Admitting: Surgery

## 2021-03-08 NOTE — Telephone Encounter (Signed)
Telephone call to patient with results of AFIRMA test - benign.  Risk of malignancy is less than 4%.  Will plan follow up in 6 months with repeat CT scan of chest and neck.  Will see in office after scan for exam and to review results.  Darnell Level, MD [facility] Office: [phone]

## 2021-04-04 ENCOUNTER — Other Ambulatory Visit: Payer: Self-pay | Admitting: Internal Medicine

## 2021-04-04 DIAGNOSIS — K7689 Other specified diseases of liver: Secondary | ICD-10-CM

## 2021-04-27 ENCOUNTER — Other Ambulatory Visit: Payer: Self-pay

## 2021-04-27 ENCOUNTER — Ambulatory Visit
Admission: RE | Admit: 2021-04-27 | Discharge: 2021-04-27 | Disposition: A | Discharge status: Home / Self Care | Payer: Medicare Other | Source: Ambulatory Visit | Attending: Internal Medicine | Admitting: Internal Medicine

## 2021-04-27 DIAGNOSIS — K7689 Other specified diseases of liver: Secondary | ICD-10-CM

## 2021-04-27 MED ORDER — GADOBENATE DIMEGLUMINE 529 MG/ML IV SOLN
10.0000 mL | Freq: Once | INTRAVENOUS | Status: AC | PRN
  Administered 2021-04-27: 10 mL via INTRAVENOUS

## 2021-05-17 ENCOUNTER — Other Ambulatory Visit: Payer: Self-pay | Admitting: Internal Medicine

## 2021-07-24 ENCOUNTER — Other Ambulatory Visit: Payer: Self-pay | Admitting: Internal Medicine

## 2021-07-24 DIAGNOSIS — R221 Localized swelling, mass and lump, neck: Secondary | ICD-10-CM

## 2021-08-03 ENCOUNTER — Other Ambulatory Visit: Payer: Self-pay

## 2021-08-03 ENCOUNTER — Ambulatory Visit
Admission: RE | Admit: 2021-08-03 | Discharge: 2021-08-03 | Disposition: A | Discharge status: Home / Self Care | Payer: Medicare Other | Source: Ambulatory Visit | Attending: Internal Medicine | Admitting: Internal Medicine

## 2021-08-03 DIAGNOSIS — R221 Localized swelling, mass and lump, neck: Secondary | ICD-10-CM

## 2021-08-03 MED ORDER — IOPAMIDOL (ISOVUE-300) INJECTION 61%
75.0000 mL | Freq: Once | INTRAVENOUS | Status: AC | PRN
  Administered 2021-08-03: 75 mL via INTRAVENOUS

## 2021-08-10 ENCOUNTER — Other Ambulatory Visit: Payer: Self-pay | Admitting: Internal Medicine

## 2021-08-10 DIAGNOSIS — R911 Solitary pulmonary nodule: Secondary | ICD-10-CM

## 2021-08-23 ENCOUNTER — Other Ambulatory Visit: Payer: Self-pay | Admitting: Internal Medicine

## 2021-08-23 DIAGNOSIS — R911 Solitary pulmonary nodule: Secondary | ICD-10-CM

## 2021-09-15 ENCOUNTER — Ambulatory Visit
Admission: RE | Admit: 2021-09-15 | Discharge: 2021-09-15 | Disposition: A | Discharge status: Home / Self Care | Payer: Medicare Other | Source: Ambulatory Visit | Attending: Internal Medicine | Admitting: Internal Medicine

## 2021-09-15 DIAGNOSIS — R911 Solitary pulmonary nodule: Secondary | ICD-10-CM

## 2022-02-19 ENCOUNTER — Other Ambulatory Visit: Payer: Self-pay | Admitting: Internal Medicine

## 2022-02-19 DIAGNOSIS — R918 Other nonspecific abnormal finding of lung field: Secondary | ICD-10-CM

## 2022-03-20 ENCOUNTER — Ambulatory Visit
Admission: RE | Admit: 2022-03-20 | Discharge: 2022-03-20 | Disposition: A | Discharge status: Home / Self Care | Payer: Medicare Other | Source: Ambulatory Visit | Attending: Internal Medicine | Admitting: Internal Medicine

## 2022-03-20 DIAGNOSIS — R918 Other nonspecific abnormal finding of lung field: Secondary | ICD-10-CM

## 2022-05-01 ENCOUNTER — Other Ambulatory Visit: Payer: Self-pay | Admitting: *Deleted

## 2022-05-01 ENCOUNTER — Ambulatory Visit: Payer: Medicare Other | Attending: Adult Health

## 2022-05-01 DIAGNOSIS — I499 Cardiac arrhythmia, unspecified: Secondary | ICD-10-CM

## 2022-05-01 DIAGNOSIS — I4891 Unspecified atrial fibrillation: Secondary | ICD-10-CM | POA: Insufficient documentation

## 2022-05-01 NOTE — Progress Notes (Unsigned)
Enrolled for [employer] to mail a ZIO XT long term holter monitor to the patients address on file.   9/12 ZIO XT serial # [id number] mailed to patient, applied in office 9/14 using tincture on benzoin to protect delicate skin.  DOD to read. Results to Dr. Nelva Nay. Shaw asap.

## 2022-05-03 ENCOUNTER — Ambulatory Visit: Payer: Medicare Other | Attending: Internal Medicine

## 2022-05-03 DIAGNOSIS — I4891 Unspecified atrial fibrillation: Secondary | ICD-10-CM

## 2022-05-03 DIAGNOSIS — I499 Cardiac arrhythmia, unspecified: Secondary | ICD-10-CM | POA: Diagnosis not present

## 2022-06-18 IMAGING — MR MR ABDOMEN WO/W CM
13 of 19 series · 27 of 47 positions shown · IV contrast (10 MULTIHANCE)
Comparison: CT on 01/31/2021

CLINICAL DATA: Indeterminate liver lesions on recent CT.

EXAM:
MRI ABDOMEN WITHOUT AND WITH CONTRAST
TECHNIQUE: Multiplanar multisequence MR imaging of the abdomen was performed
both before and after the administration of intravenous contrast.
CONTRAST:  10mL MULTIHANCE GADOBENATE DIMEGLUMINE 529 MG/ML IV SOLN

[Series 3: cor haste · coronal · 5.0mm · 0.68mm/px · 2 of 5 slices shown (1 of 2)]
[im 1/5]
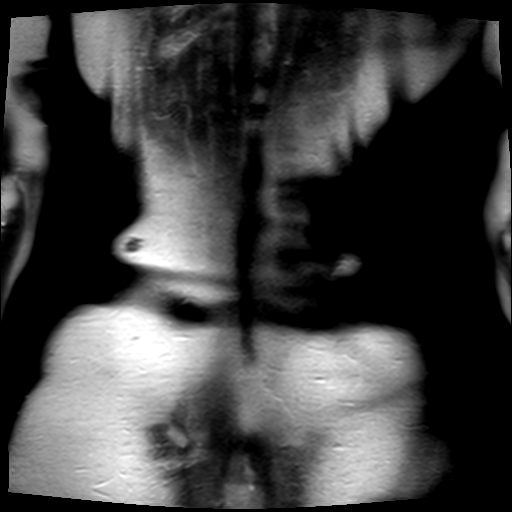
[im 5/5]
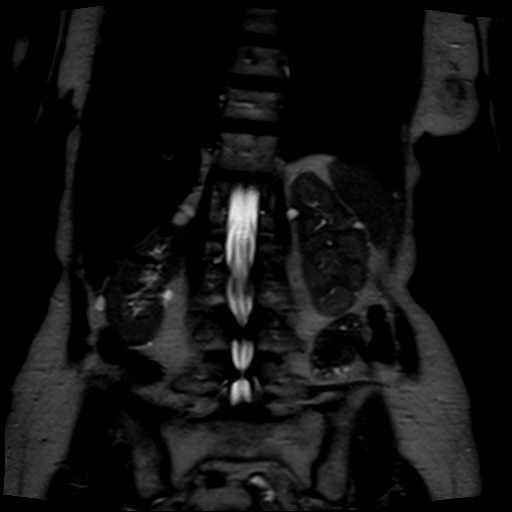

[Series 4: axial haste · axial · 6.0mm · 0.68mm/px · 1 of 1 slices shown (1 of 2)]
[im 1/1]
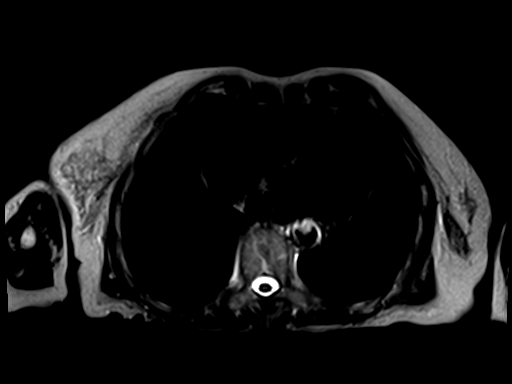

[Series 5: cor haste · coronal · 5.0mm · 0.68mm/px · 2 of 32 slices shown (2 of 2)]
[im 1/32]
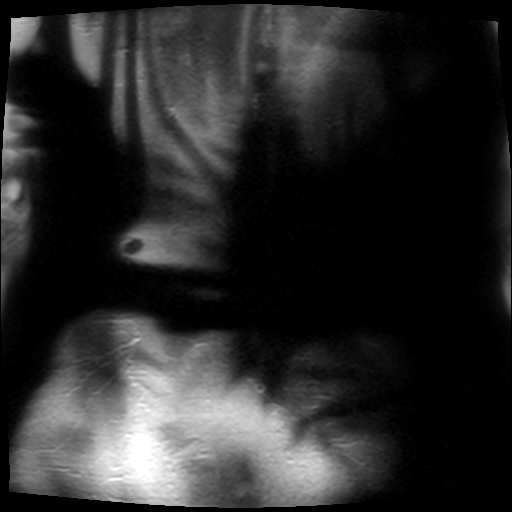
[im 32/32]
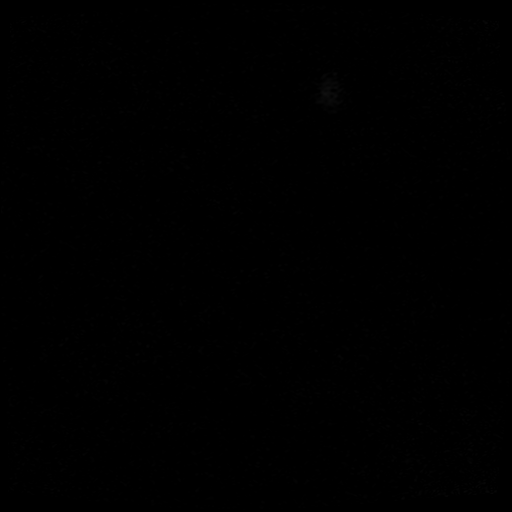

[Series 6: axial haste · axial · 6.0mm · 0.68mm/px · 1 of 33 slices shown (2 of 2)]
[im 1/33]
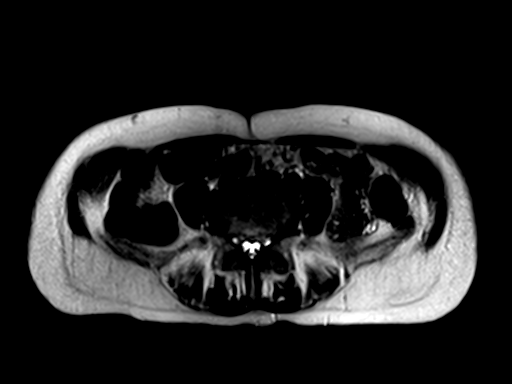

[Series 7: T1 · axial · 6.0mm · 0.68mm/px · z∈[-127,+84]mm · 3 of 66 slices shown]
[im 1/66]
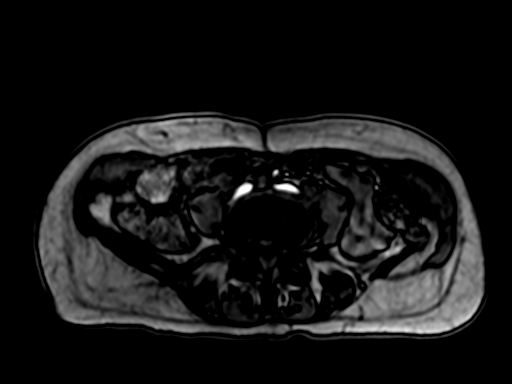
[im 33/66]
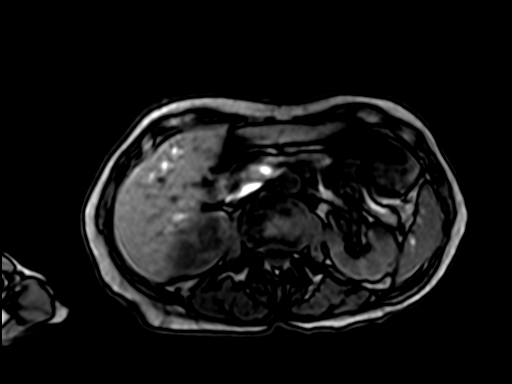
[im 66/66]
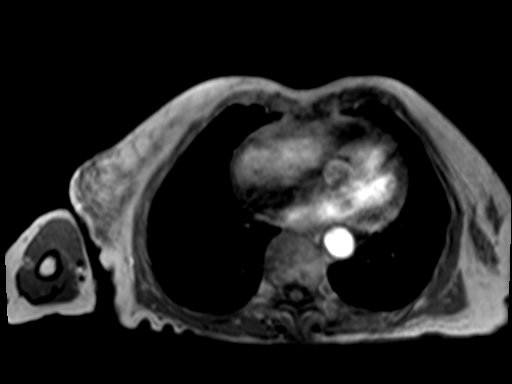

[Series 8: bSSFP · axial · 4.0mm · 0.68mm/px · z∈[-151,+88]mm · 2 of 61 slices shown]
[im 1/61]
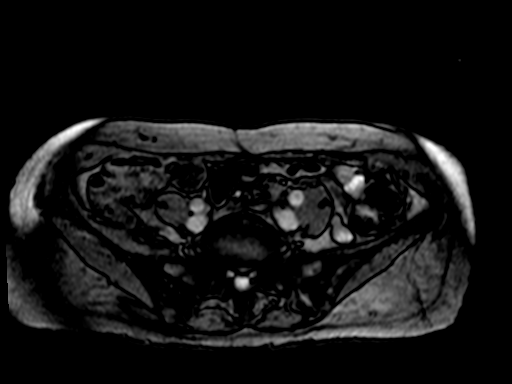
[im 61/61]
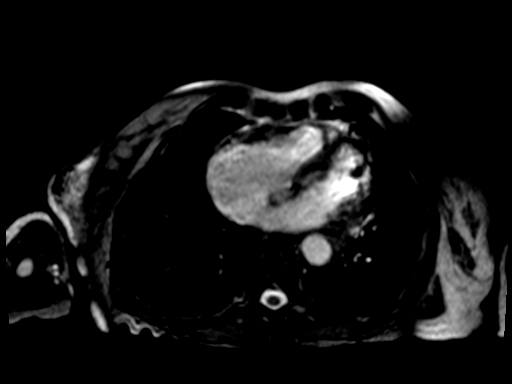

[Series 9: T2 fat-sat · axial · 6.0mm · 1.09mm/px · 1 of 32 slices shown]
[im 1/32]
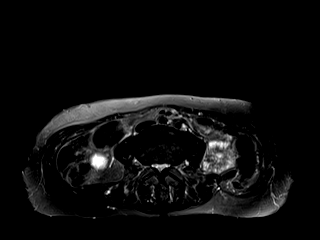

[Series 10: ep2d_diff_b50_500_800_p2_trig · axial · 6.0mm · 1.82mm/px · z∈[-130,+93]mm · 4 of 96 slices shown]
[im 1/96]
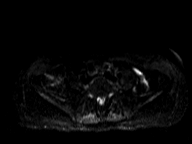
[im 32/96]
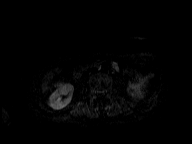
[im 64/96]
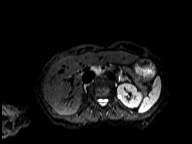
[im 96/96]
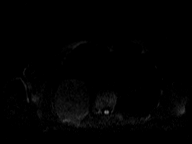

[Series 11: ep2d_diff_b50_500_800_p2_trig_adc · axial · 6.0mm · 1.82mm/px · 1 of 32 slices shown]
[im 1/32]
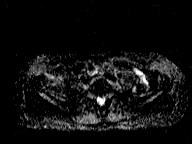

[Series 12: T1 dynamic · axial · non-contrast · 2.5mm · 0.74mm/px · z∈[-125,+92]mm · 3 of 88 slices shown]
[im 1/88]
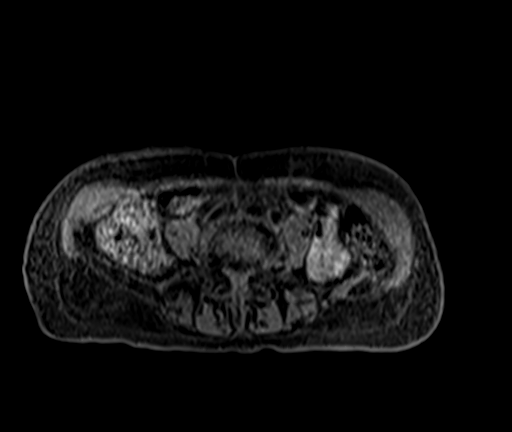
[im 44/88]
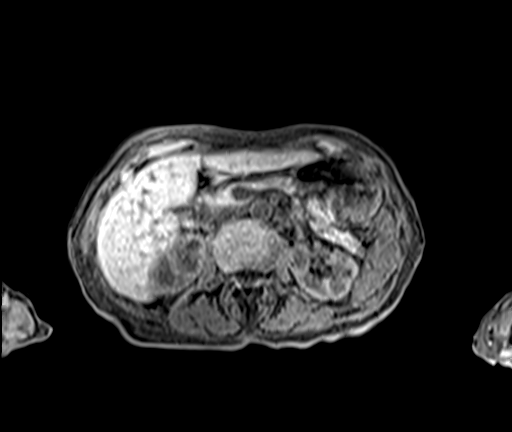
[im 88/88]
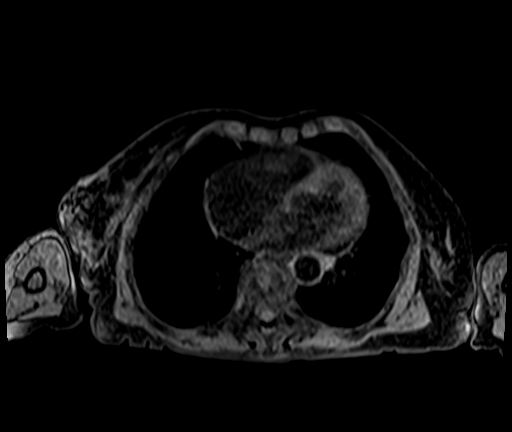

[Series 13: T1 dynamic post-contrast · axial · 2.5mm · 0.74mm/px · z∈[-125,+92]mm · 3 of 88 slices shown (1 of 3)]
[im 1/88]
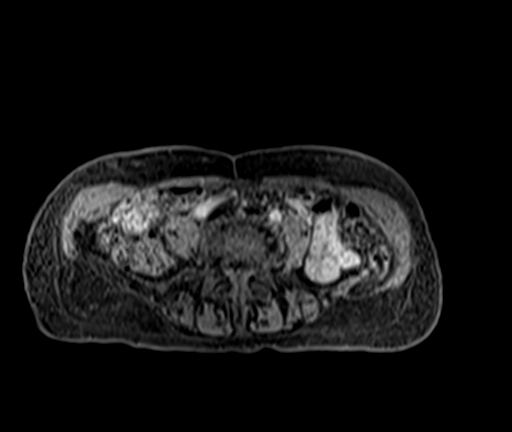
[im 44/88]
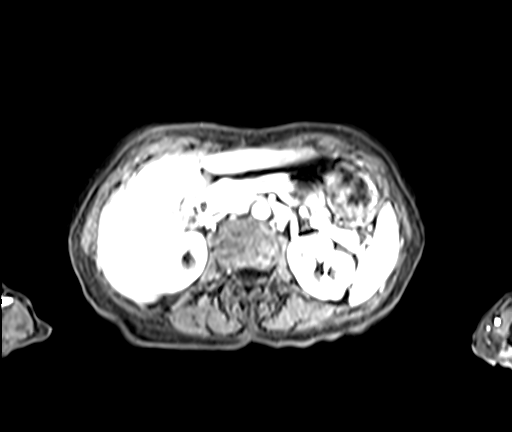
[im 88/88]
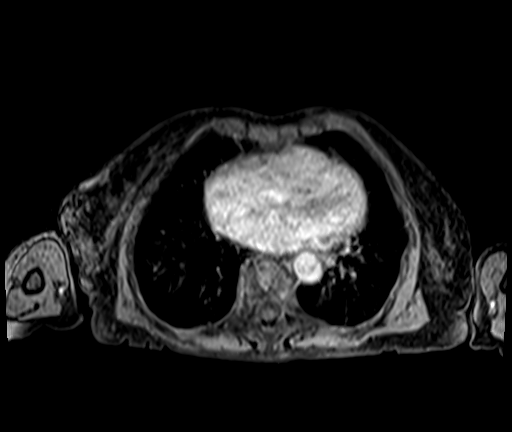

[Series 14: T1 dynamic post-contrast · axial · 2.5mm · 0.74mm/px · z∈[-125,+92]mm · 3 of 88 slices shown (2 of 3)]
[im 1/88]
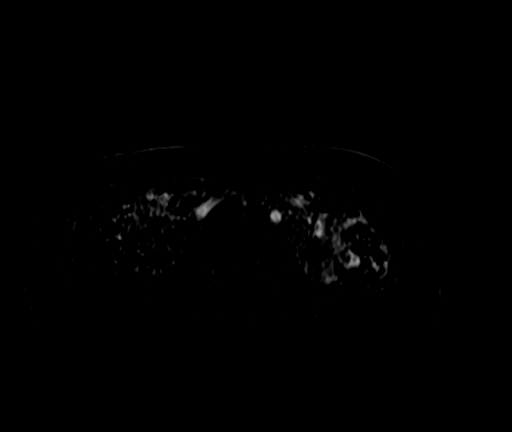
[im 44/88]
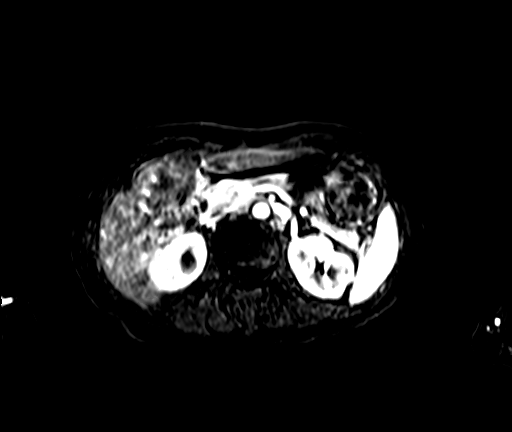
[im 88/88]
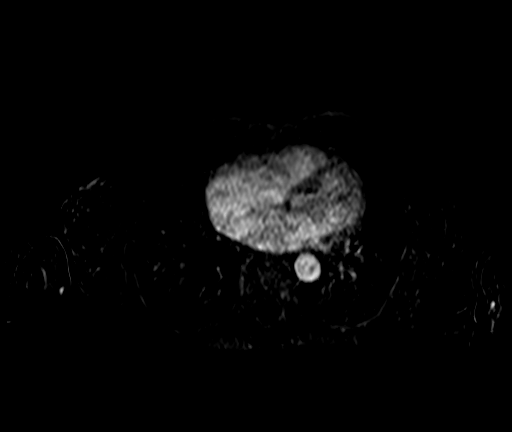

[Series 15: T1 dynamic post-contrast · axial · 2.5mm · 0.74mm/px · 1 of 88 slices shown (3 of 3)]
[im 1/88]
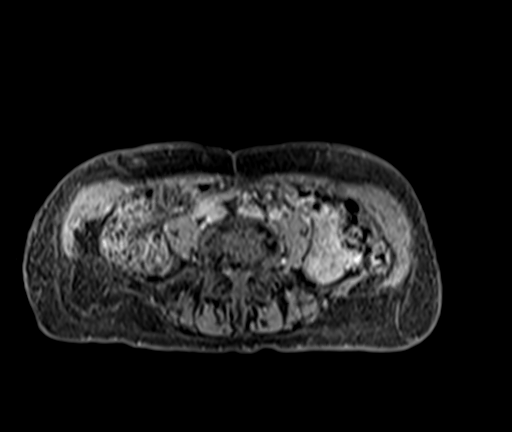

[27 of 47 positions shown; findings below may reference images not displayed]

FINDINGS: Lower chest: No acute findings.

Hepatobiliary: Multiple small benign-appearing hepatic cysts are
seen which correspond the lesion seen on recent CT. Largest located
in the left hepatic lobe measures 1.9 cm. No hepatic masses
identified. Gallbladder is unremarkable. No evidence of biliary
ductal dilatation.

Pancreas:  No mass or inflammatory changes.

Spleen:  Within normal limits in size and appearance.

Adrenals/Urinary Tract: No masses identified. Multiple small
benign-appearing renal cysts are seen bilaterally. No evidence of
hydronephrosis.

Stomach/Bowel: Visualized portion unremarkable.

Vascular/Lymphatic: No pathologically enlarged lymph nodes
identified. No acute vascular findings.

Other:  None.

Musculoskeletal:  No suspicious bone lesions identified.
IMPRESSION: Multiple small benign-appearing hepatic cysts, which correspond to
the lesions seen on recent CT. No evidence of hepatic neoplasm or
other significant abnormality.

## 2022-07-05 ENCOUNTER — Telehealth: Payer: Self-pay | Admitting: Internal Medicine

## 2022-07-05 MED ORDER — METOPROLOL SUCCINATE ER 25 MG PO TB24: 25.0000 mg | ORAL_TABLET | Freq: Every day | ORAL | 3 refills | Status: DC

## 2022-07-05 NOTE — Telephone Encounter (Signed)
Spoke to patient  She is not dizzy  no palpitations   I saw pt in 2016 She wore monitor as recomm by Dr Clelia Croft  Frequent bursts of SVT   Short lived   20 seconds of SVT Not prolonged   Not definitely atrial fibrillation  Toprol XL 25 daily      Get appt with me, EP or APP   [facility] on [address]  [phone]  [facility] [phone]  (CV surgeon)

## 2022-07-05 NOTE — Addendum Note (Signed)
Addended by: Bertram Millard on: 07/05/2022 12:18 PM   Modules accepted: Orders

## 2022-07-05 NOTE — Telephone Encounter (Addendum)
RX sent in for the pt and appt made for next Wed 07/11/22 with Robin Searing NP.... but declined any sooner appts and says this is the best date for her.

## 2022-07-10 NOTE — Progress Notes (Unsigned)
Office Visit    Patient Name: Tonya Fischer Date of Encounter: 07/11/2022  Primary Care Provider:  Cleatis Polka., MD Primary Cardiologist:  None Primary Electrophysiologist: None  Chief Complaint    Tonya Fischer is a 87 y.o. female with PMH of HTN, HLD, GERD, atherosclerotic aorta, goiter, peptic stricture of esophagus who presents today for follow-up of recent event monitor.  Past Medical History    Past Medical History:  Diagnosis Date   GERD (gastroesophageal reflux disease)    Hyperlipidemia    Hypertension    Peptic stricture of esophagus    Past Surgical History:  Procedure Laterality Date   COLONOSCOPY     POLYPECTOMY     VAGINAL HYSTERECTOMY     1984    Allergies  No Known Allergies  History of Present Illness    Tonya Fischer  is a 87 year old female with the above mention past medical history who presents today for palpitations and event monitor follow-up.  She was initially seen by Dr. Tenny Craw in 2016 for palpitations.  She was given 48-hour Holter monitor that revealed occasional paired PVCs with 1 run of nonsustained VT of 8 beats.  She was seen by her PCP on 04/20/2022 and EKG showed PVCs and ectopic beats.  She was given 14-day ZIO monitor for quantification of arrhythmia.  Her monitor revealed 1 nonsustained VT lasting 8 beats and 12.6% SVT ectopy.  She was given Toprol-XL 25 mg daily.  She was contacted by Dr. Tenny Craw and spoke over the phone.  Ms. Heiss presents today alone for follow-up.  Since last being seen in the office patient reports she has been doing well with no complaints of dizziness or palpitations.  Her blood pressures were noted to be elevated initially at 162/94 and reduced to 152/72 on recheck.  I advised her that we will continue to monitor and may need to restart her medications in the future during her visit we reviewed her event monitor results and all questions were answered.  I explained that atrial fibrillation was not noted on her  monitor when completed.  Patient denies chest pain, palpitations, dyspnea, PND, orthopnea, nausea, vomiting, dizziness, syncope, edema, weight gain, or early satiety.  Home Medications    Current Outpatient Medications  Medication Sig Dispense Refill   aspirin 81 MG tablet Take 81 mg by mouth daily. 1-2 daily     metoprolol succinate (TOPROL XL) 25 MG 24 hr tablet Take 1 tablet (25 mg total) by mouth daily. 90 tablet 3   Multiple Vitamins-Minerals (MULTIVITAMIN WITH MINERALS) tablet Take 1 tablet by mouth daily.       No current facility-administered medications for this visit.     Review of Systems  Please see the history of present illness.    (+) Fatigue okay  All other systems reviewed and are otherwise negative except as noted above.  Physical Exam    Wt Readings from Last 3 Encounters:  07/11/22 107 lb (48.5 kg)  02/02/20 130 lb (59 kg)  04/18/15 143 lb 8 oz (65.1 kg)   VS: Vitals:   07/11/22 1326 07/11/22 1414  BP: (!) 162/94 (!) 152/72  Pulse: 79   SpO2: 96%   ,Body mass index is 17.27 kg/m.  Constitutional:      Appearance: Healthy appearance. Not in distress.  Neck:     Vascular: JVD normal.  Pulmonary:     Effort: Pulmonary effort is normal.     Breath sounds:  No wheezing. No rales. Diminished in the bases Cardiovascular:     Normal rate. Regular rhythm. Normal S1. Normal S2.      Murmurs: There is no murmur.  Edema:    Peripheral edema absent.  Abdominal:     Palpations: Abdomen is soft non tender. There is no hepatomegaly.  Skin:    General: Skin is warm and dry.  Neurological:     General: No focal deficit present.     Mental Status: Alert and oriented to person, place and time.     Cranial Nerves: Cranial nerves are intact.  EKG/LABS/Other Studies Reviewed    ECG personally reviewed by me today -sinus rhythm with premature SVT complexes and rate of 89 bpm with no acute changes noted.    Lab Results  Component Value Date   WBC 9.3  01/26/2021   HGB 12.8 01/26/2021   HCT 38.4 01/26/2021   MCV 83.3 01/26/2021   PLT 280.0 01/26/2021   Lab Results  Component Value Date   CREATININE 0.79 01/26/2021   BUN 12 01/26/2021   NA 137 01/26/2021   K 3.4 (L) 01/26/2021   CL 99 01/26/2021   CO2 29 01/26/2021   Lab Results  Component Value Date   ALT 20 01/26/2021   AST 23 01/26/2021   ALKPHOS 99 01/26/2021   BILITOT 0.5 01/26/2021   No results found for: "CHOL", "HDL", "LDLCALC", "LDLDIRECT", "TRIG", "CHOLHDL"  No results found for: "HGBA1C"  Assessment & Plan    1.  History of SVT: -Recent ZIO monitor worn revealing frequent burst of SVT that were short-lived with no evidence of atrial fibrillation. -Today patient is asymptomatic and has not reported any  -Continue Toprol-XL 25 mg daily  2.  Essential hypertension: Patient reports that blood pressure medicines were discontinued by PCP.  Advised her to follow-up with PCP regarding reinitiation of medications.  HYPERTENSION CONTROL Vitals:   07/11/22 1326 07/11/22 1414  BP: (!) 162/94 (!) 152/72    The patient's blood pressure is elevated above target today.  In order to address the patient's elevated BP: Blood pressure will be monitored at home to determine if medication changes need to be made.; Follow up with primary care provider for management.      3.  Hyperlipidemia: -Continue Zocor 10 mg daily  4.  Aortic atherosclerosis: -Continue GDMT with ASA 81 mg and Zocor 10 mg  Disposition: Follow-up with Dr. Harrington Challenger or APP as scheduled    Medication Adjustments/Labs and Tests Ordered: Current medicines are reviewed at length with the patient today.  Concerns regarding medicines are outlined above.   Signed, Mable Fill, Marissa Nestle, NP 07/11/2022, 4:11 PM [facility]  Note:  This document was prepared using Dragon voice recognition software and may include unintentional dictation errors.

## 2022-07-11 ENCOUNTER — Ambulatory Visit: Payer: Medicare Other | Attending: Nurse Practitioner | Admitting: Nurse Practitioner

## 2022-07-11 ENCOUNTER — Encounter: Payer: Self-pay | Admitting: Nurse Practitioner

## 2022-07-11 VITALS — BP 152/72 | HR 79 | Ht 66.0 in | Wt 107.0 lb

## 2022-07-11 DIAGNOSIS — E785 Hyperlipidemia, unspecified: Secondary | ICD-10-CM | POA: Diagnosis not present

## 2022-07-11 DIAGNOSIS — I700 Atherosclerosis of aorta: Secondary | ICD-10-CM | POA: Diagnosis not present

## 2022-07-11 DIAGNOSIS — I471 Supraventricular tachycardia, unspecified: Secondary | ICD-10-CM | POA: Diagnosis not present

## 2022-07-11 DIAGNOSIS — I10 Essential (primary) hypertension: Secondary | ICD-10-CM | POA: Diagnosis not present

## 2022-07-11 NOTE — Patient Instructions (Signed)
Medication Instructions:  Your physician recommends that you continue on your current medications as directed. Please refer to the Current Medication list given to you today.   *If you need a refill on your cardiac medications before your next appointment, please call your pharmacy*   Lab Work: None Ordered   Testing/Procedures: None ordered   Follow-Up: At [facility], you and your health needs are our priority.  As part of our continuing mission to provide you with exceptional heart care, we have created designated Provider Care Teams.  These Care Teams include your primary Cardiologist (physician) and Advanced Practice Providers (APPs -  Physician Assistants and Nurse Practitioners) who all work together to provide you with the care you need, when you need it.  We recommend signing up for the patient portal called "MyChart".  Sign up information is provided on this After Visit Summary.  MyChart is used to connect with patients for Virtual Visits (Telemedicine).  Patients are able to view lab/test results, encounter notes, upcoming appointments, etc.  Non-urgent messages can be sent to your provider as well.   To learn more about what you can do with MyChart, go to [email or web address].    Your next appointment:   FIRST AVAILABLE   The format for your next appointment:   In Person  Provider:   Dietrich Pates, MD  Other Instructions Check your blood pressure daily   Important Information About Sugar

## 2022-07-23 ENCOUNTER — Telehealth: Payer: Self-pay

## 2022-07-23 NOTE — Telephone Encounter (Signed)
Pt is returning call. Transferred to Ann, RN.  

## 2022-07-23 NOTE — Telephone Encounter (Addendum)
Left a message for the pt to call back.   Per Dr Tenny Craw; Pt just seen by Alden Server   Please make appt witn me in May/June

## 2022-07-23 NOTE — Telephone Encounter (Signed)
-----   Message from Pricilla Riffle, MD sent at 07/13/2022  6:08 AM EST ----- Regarding: FW: Appointment for A-fib Pt just seen by Alden Server   Please make appt witn me in May/June ----- Message ----- From: Hilarie Fredrickson, MD Sent: 07/08/2022  10:24 AM EST To: Pricilla Riffle, MD Subject: RE: Appointment for A-fib                      Thanks Gunnar Fusi! Happy Thanksgiving. John   ----- Message ----- From: Pricilla Riffle, MD Sent: 07/06/2022   7:23 PM EST To: Hilarie Fredrickson, MD; Bertram Millard, RN Subject: RE: Appointment for A-fib                      Thanks John  I spoke to pt and to her son Virl Diamond)  Monitor showed very short bursts of SVT   Not afib   Starting low dose metoprolol  Will have her come in to clinic     Arrange for a repeat monitor in 3 months on meds  ----- Message ----- From: Hilarie Fredrickson, MD Sent: 07/04/2022  10:38 AM EST To: Hilarie Fredrickson, MD; Pricilla Riffle, MD Subject: Appointment for A-fib                          Aurther Loft,  Mrs. Louissaint was recently diagnosed with A-fib via Holter monitoring.  Her PCP, Dr. Clelia Croft, wanted to arrange cardiology evaluation.  She reached out to me as her physician and friend for a reference.  Her 2 sons are very good friends of mine from the [employer] medicine days.  Loraine Leriche is a Surveyor, quantity in [place] and Jeannett Senior is an ophthalmologist in [place].  You actually saw her once in 2016 for palpitations.  If you could kindly arrange to see her in your office for evaluation, I would be grateful.  Thanks so much, in advance.  Happy holidays to you, Tammy Sours, and the family.  Jonny Ruiz

## 2022-07-23 NOTE — Telephone Encounter (Signed)
Pts Jan 2024 appt rescheduled for 02/13/23 per Dr Tenny Craw request.

## 2022-08-27 ENCOUNTER — Ambulatory Visit: Payer: Medicare Other | Admitting: Internal Medicine

## 2023-02-12 NOTE — Progress Notes (Signed)
Cardiology Office Note   Date:  02/13/2023   ID:  Tonya Fischer, DOB [birth year], MRN [id number]  PCP:  Cleatis Polka., MD  Cardiologist:   Dietrich Pates, MD   Pt presents for follow up of HTN, CAD, palpitations    History of Present Illness: Tonya Fischer is a 88 y.o. female with a history of HTN,  HL, CAD on CT scan  atherosclerosis of aorta, and  esophageal stricture  I saw the pt in [facility] in 2016   Monitor in past showed Showed one episode NSVT and Occasional PACs  Placed on Toprol XL   In NOv 2023 she was seen by Inda Coke,   Monitro showed 12.6% PACs   Longest bursts of SVT was 20.7 seconds  No afib   Pt without symptoms   Since seen the pt denies palpitations  NO   No SOB   No dizziness  No weakness   Current Outpatient Medications  Medication Sig Dispense Refill   aspirin 81 MG tablet Take 81 mg by mouth daily. 1-2 daily     KLOR-CON 20 MEQ packet Take 20 mEq by mouth daily.     metoprolol succinate (TOPROL XL) 25 MG 24 hr tablet Take 1 tablet (25 mg total) by mouth daily. 90 tablet 3   Multiple Vitamins-Minerals (MULTIVITAMIN WITH MINERALS) tablet Take 1 tablet by mouth daily.       No current facility-administered medications for this visit.    Allergies:   Patient has no known allergies.   Past Medical History:  Diagnosis Date   GERD (gastroesophageal reflux disease)    Hyperlipidemia    Hypertension    Peptic stricture of esophagus     Past Surgical History:  Procedure Laterality Date   COLONOSCOPY     POLYPECTOMY     VAGINAL HYSTERECTOMY     1984     Social History:  The patient  reports that she has never smoked. She has never used smokeless tobacco. She reports current alcohol use. She reports that she does not use drugs.   Family History:  The patient's family history includes Stomach cancer in her paternal uncle; Stroke in her mother.    ROS:  Please see the history of present illness. All other systems are reviewed and  Negative to the above  problem except as noted.    PHYSICAL EXAM: VS:  BP 138/88   Pulse 68   Ht 5\' 6"  (1.676 m)   Wt 105 lb 12.8 oz (48 kg)   SpO2 98%   BMI 17.08 kg/m   GEN: Well nourished, well developed, in no acute distress HEENT: normal Neck: JVP is normal  Cardiac: RRR; no murmurs  No LE  edema  Respiratory:  clear to auscultation GI: soft, nontender, No hepatomegaly   EKG:  EKG is ordered today. SR with occasional PAC   86 bpm    Zio monitor   Fall 2023 HR 55 - 261 bpm, average 84 bpm. 1 nonsustained VT lasting 8 beats at an average rate 191 bpm. 11002 SVT episodes, longest 20.7 seconds with an average rate 107 bpm. Frequent supraventricular ectopy, 12.6%. Rare ventricular ectopy. No sustained arrhythmias. Lipid Panel No results found for: "CHOL", "TRIG", "HDL", "CHOLHDL", "VLDL", "LDLCALC", "LDLDIRECT"    Wt Readings from Last 3 Encounters:  02/13/23 105 lb 12.8 oz (48 kg)  07/11/22 107 lb (48.5 kg)  02/02/20 130 lb (59 kg)      ASSESSMENT AND PLAN:  1.  Palpitations.  Pt asymptomatic   Monitor shows  no signifciant  arrhythmias  2  CAD   / atherosclerosis   Found on CT scan of chest Pt asymptomatic  3.  HL  LDL 186  HDL 64  Pt had been on simvistatin and atorvastatin in past   Not clear why stopped   Will try Crestor 10 mg  Follow up lipids and liver panel and CK in 8 wks   3.  HTN  Fair control   OK for age      Signed, Dietrich Pates, MD  02/13/2023 2:15 PM    [facility] [address] [place], [place], [place]  [address] Phone: [phone]; Fax: [phone]

## 2023-02-13 ENCOUNTER — Encounter: Payer: Self-pay | Admitting: Internal Medicine

## 2023-02-13 ENCOUNTER — Ambulatory Visit: Payer: Medicare Other | Attending: Internal Medicine | Admitting: Internal Medicine

## 2023-02-13 VITALS — BP 138/88 | HR 68 | Ht 66.0 in | Wt 105.8 lb

## 2023-02-13 DIAGNOSIS — I10 Essential (primary) hypertension: Secondary | ICD-10-CM | POA: Diagnosis present

## 2023-02-13 DIAGNOSIS — I700 Atherosclerosis of aorta: Secondary | ICD-10-CM | POA: Diagnosis present

## 2023-02-13 DIAGNOSIS — E785 Hyperlipidemia, unspecified: Secondary | ICD-10-CM | POA: Insufficient documentation

## 2023-02-13 MED ORDER — ROSUVASTATIN CALCIUM 10 MG PO TABS: 10.0000 mg | ORAL_TABLET | Freq: Every day | ORAL | 3 refills | Status: DC

## 2023-02-13 NOTE — Patient Instructions (Addendum)
Medication Instructions:  Your physician has recommended you make the following change in your medication:   1) START rosuvastatin (Crestor) 10mg  daily  *If you need a refill on your cardiac medications before your next appointment, please call your pharmacy*  Lab Work: In 8 weeks: Liver and lipid panels, CK (fasting labs) If you have labs (blood work) drawn today and your tests are completely normal, you will receive your results only by: MyChart Message (if you have MyChart) OR A paper copy in the mail If you have any lab test that is abnormal or we need to change your treatment, we will call you to review the results.  Testing/Procedures: None ordered today.  Follow-Up: At [facility], you and your health needs are our priority.  As part of our continuing mission to provide you with exceptional heart care, we have created designated Provider Care Teams.  These Care Teams include your primary Cardiologist (physician) and Advanced Practice Providers (APPs -  Physician Assistants and Nurse Practitioners) who all work together to provide you with the care you need, when you need it.  Your next appointment:   6-7 month(s) December 2024/January 2025  The format for your next appointment:   In Person  Provider:   Dietrich Pates, MD {

## 2023-04-17 ENCOUNTER — Ambulatory Visit: Payer: Medicare Other | Attending: Internal Medicine

## 2023-04-17 DIAGNOSIS — E785 Hyperlipidemia, unspecified: Secondary | ICD-10-CM

## 2023-04-17 LAB — LIPID PANEL
Chol/HDL Ratio: 2.7 ratio (ref 0.0–4.4)
Cholesterol, Total: 184 mg/dL (ref 100–199)
HDL: 68 mg/dL (ref >39)
LDL Chol Calc (NIH): 101 mg/dL — ABNORMAL HIGH (ref 0–99)
Triglycerides: 80 mg/dL (ref 0–149)
VLDL Cholesterol Cal: 15 mg/dL (ref 5–40)

## 2023-04-17 LAB — CK: Total CK: 60 U/L (ref 26–161)

## 2023-04-17 LAB — HEPATIC FUNCTION PANEL
ALT: 31 IU/L (ref 0–32)
AST: 35 IU/L (ref 0–40)
Albumin: 4.5 g/dL (ref 3.7–4.7)
Alkaline Phosphatase: 95 IU/L (ref 44–121)
Bilirubin Total: 0.5 mg/dL (ref 0.0–1.2)
Bilirubin, Direct: 0.17 mg/dL (ref 0.00–0.40)
Total Protein: 7.3 g/dL (ref 6.0–8.5)

## 2023-07-22 NOTE — Progress Notes (Unsigned)
Cardiology Office Note   Date:  07/23/2023   ID:  MAIJA SUSMAN, DOB Feb 08, 1935, MRN [id number]  PCP:  Cleatis Polka., MD  Cardiologist:   Dietrich Pates, MD   Pt presents for follow up of HTN, CAD, palpitations    History of Present Illness: Tonya Fischer is a 88 y.o. female with a history of HTN,  HL, CAD on CT scan  atherosclerosis of aorta, and  esophageal stricture  I saw the pt in clinc in 2016   Monitor in past showed Showed one episode NSVT and Occasional PACs  Placed on Toprol XL  Cardiac monitor in 2023 showed NSR   12.6% PACs   Bursts of SVT, longest 20.7 seconds   No afib  pt asymptomatic     I saw the pt in June 2024  Since seen she says she feels good   Breathing is good  No dizziness No Cp  No palpitations   Current Outpatient Medications  Medication Sig Dispense Refill   aspirin 81 MG tablet Take 81 mg by mouth daily. 1-2 daily     KLOR-CON 20 MEQ packet Take 20 mEq by mouth daily.     metoprolol succinate (TOPROL XL) 25 MG 24 hr tablet Take 1 tablet (25 mg total) by mouth daily. 90 tablet 3   Multiple Vitamins-Minerals (MULTIVITAMIN WITH MINERALS) tablet Take 1 tablet by mouth daily.       rosuvastatin (CRESTOR) 10 MG tablet Take 1 tablet (10 mg total) by mouth daily. 90 tablet 3   No current facility-administered medications for this visit.    Allergies:   Patient has no known allergies.   Past Medical History:  Diagnosis Date   GERD (gastroesophageal reflux disease)    Hyperlipidemia    Hypertension    Peptic stricture of esophagus     Past Surgical History:  Procedure Laterality Date   COLONOSCOPY     POLYPECTOMY     VAGINAL HYSTERECTOMY     1984     Social History:  The patient  reports that she has never smoked. She has never used smokeless tobacco. She reports current alcohol use. She reports that she does not use drugs.   Family History:  The patient's family history includes Stomach cancer in her paternal uncle; Stroke in her mother.     ROS:  Please see the history of present illness. All other systems are reviewed and  Negative to the above problem except as noted.    PHYSICAL EXAM: VS:  BP (!) 148/90   Pulse 86   Ht 5\' 6"  (1.676 m)   Wt 112 lb 12.8 oz (51.2 kg)   SpO2 95%   BMI 18.21 kg/m   GEN: Well nourished, well developed, in no acute distress HEENT: normal Neck: JVP is not elevated   No bruits  Cardiac: RRR; no murmurs  No LE  edema  Respiratory:  clear to auscultation GI: soft, nontender, No hepatomegaly   EKG:  EKG is not ordered    Zio monitor   Fall 2023 HR 55 - 261 bpm, average 84 bpm. 1 nonsustained VT lasting 8 beats at an average rate 191 bpm. 11002 SVT episodes, longest 20.7 seconds with an average rate 107 bpm. Frequent supraventricular ectopy, 12.6%. Rare ventricular ectopy. No sustained arrhythmias. Lipid Panel    Component Value Date/Time   CHOL 184 04/17/2023 0850   TRIG 80 04/17/2023 0850   HDL 68 04/17/2023 0850   CHOLHDL 2.7 04/17/2023 0850  LDLCALC 101 (H) 04/17/2023 0850      Wt Readings from Last 3 Encounters:  07/23/23 112 lb 12.8 oz (51.2 kg)  02/13/23 105 lb 12.8 oz (48 kg)  07/11/22 107 lb (48.5 kg)      ASSESSMENT AND PLAN:  1.  Palpitations. Monitor in 2023 showed SR with 12% PACs   Pt asymptomatic    keep on low dose b blocker      2  CAD   / atherosclerosis   Found on CT scan of chest Pt asymptomatic  3.  HL  At one point, LDL was 186  HDL 64  Pt had been on simvistatin and atorvastatin in past   Not clear why stopped Now on Crestor 10 mg   Will check lipomed   Follow    4.  HTN BP is elevated here   On my check is 140/90   I have asked her to check at home and send in readings over the next few weeks     Will check CMET and A1C along with lipomed    Signed, Dietrich Pates, MD  07/23/2023 1:20 PM    [facility] [address] [place], [place], [place]  [address] Phone: [phone]; Fax: [phone]

## 2023-07-23 ENCOUNTER — Encounter: Payer: Self-pay | Admitting: Internal Medicine

## 2023-07-23 ENCOUNTER — Ambulatory Visit: Payer: Medicare Other | Attending: Internal Medicine | Admitting: Internal Medicine

## 2023-07-23 VITALS — BP 148/90 | HR 86 | Ht 66.0 in | Wt 112.8 lb

## 2023-07-23 DIAGNOSIS — I10 Essential (primary) hypertension: Secondary | ICD-10-CM | POA: Diagnosis not present

## 2023-07-23 DIAGNOSIS — Z79899 Other long term (current) drug therapy: Secondary | ICD-10-CM | POA: Diagnosis not present

## 2023-07-23 DIAGNOSIS — I700 Atherosclerosis of aorta: Secondary | ICD-10-CM | POA: Diagnosis not present

## 2023-07-23 DIAGNOSIS — E785 Hyperlipidemia, unspecified: Secondary | ICD-10-CM | POA: Insufficient documentation

## 2023-07-23 NOTE — Patient Instructions (Signed)
Medication Instructions:   *If you need a refill on your cardiac medications before your next appointment, please call your pharmacy*   Lab Work: NMR, CMET, HGBA1C If you have labs (blood work) drawn today and your tests are completely normal, you will receive your results only by: MyChart Message (if you have MyChart) OR A paper copy in the mail If you have any lab test that is abnormal or we need to change your treatment, we will call you to review the results.   Testing/Procedures:    Follow-Up: At [facility], you and your health needs are our priority.  As part of our continuing mission to provide you with exceptional heart care, we have created designated Provider Care Teams.  These Care Teams include your primary Cardiologist (physician) and Advanced Practice Providers (APPs -  Physician Assistants and Nurse Practitioners) who all work together to provide you with the care you need, when you need it.  We recommend signing up for the patient portal called "MyChart".  Sign up information is provided on this After Visit Summary.  MyChart is used to connect with patients for Virtual Visits (Telemedicine).  Patients are able to view lab/test results, encounter notes, upcoming appointments, etc.  Non-urgent messages can be sent to your provider as well.   To learn more about what you can do with MyChart, go to [email or web address].    SEND IN YOUR BP READINGS IN THE ENVELOPE PROVIDED.

## 2023-07-24 LAB — NMR, LIPOPROFILE
Cholesterol, Total: 173 mg/dL (ref 100–199)
HDL Particle Number: 31 umol/L (ref >=30.5)
HDL-C: 60 mg/dL (ref >39)
LDL Particle Number: 918 nmol/L (ref <1000)
LDL Size: 21.7 nmol (ref >20.5)
LDL-C (NIH Calc): 95 mg/dL (ref 0–99)
LP-IR Score: <25 (ref <=45)
Small LDL Particle Number: 216 nmol/L (ref <=527)
Triglycerides: 100 mg/dL (ref 0–149)

## 2023-07-24 LAB — COMPREHENSIVE METABOLIC PANEL
ALT: 25 [IU]/L (ref 0–32)
AST: 36 [IU]/L (ref 0–40)
Albumin: 4.6 g/dL (ref 3.7–4.7)
Alkaline Phosphatase: 114 [IU]/L (ref 44–121)
BUN/Creatinine Ratio: 24 (ref 12–28)
BUN: 18 mg/dL (ref 8–27)
Bilirubin Total: 0.4 mg/dL (ref 0.0–1.2)
CO2: 20 mmol/L (ref 20–29)
Calcium: 10.5 mg/dL — ABNORMAL HIGH (ref 8.7–10.3)
Chloride: 102 mmol/L (ref 96–106)
Creatinine, Ser: 0.75 mg/dL (ref 0.57–1.00)
Globulin, Total: 3.1 g/dL (ref 1.5–4.5)
Glucose: 109 mg/dL — ABNORMAL HIGH (ref 70–99)
Potassium: 4.3 mmol/L (ref 3.5–5.2)
Sodium: 142 mmol/L (ref 134–144)
Total Protein: 7.7 g/dL (ref 6.0–8.5)
eGFR: 77 mL/min/{1.73_m2} (ref >59)

## 2023-07-24 LAB — HEMOGLOBIN A1C
Est. average glucose Bld gHb Est-mCnc: 120 mg/dL
Hgb A1c MFr Bld: 5.8 % — ABNORMAL HIGH (ref 4.8–5.6)

## 2023-10-01 ENCOUNTER — Other Ambulatory Visit: Payer: Self-pay | Admitting: Internal Medicine

## 2024-02-07 ENCOUNTER — Telehealth: Payer: Self-pay

## 2024-02-07 DIAGNOSIS — R413 Other amnesia: Secondary | ICD-10-CM

## 2024-02-19 ENCOUNTER — Telehealth: Payer: Self-pay | Admitting: *Deleted

## 2024-02-19 NOTE — Progress Notes (Signed)
 Complex Care Management Note  Care Guide Note 02/19/2024 Name: KEONIA PASKO MRN: [id number] DOB: September 04, 1934  KRYSTEENA STALKER is a 89 y.o. year old female who sees Loreli, Elsie JONETTA Raddle., MD for primary care. I reached out to Princella JULIANNA Postin son by phone today to offer complex care management services.  Ms. Krist son  was given information about Complex Care Management services today including:   The Complex Care Management services include support from the care team which includes your Nurse Care Manager, Clinical Social Worker, or Pharmacist.  The Complex Care Management team is here to help remove barriers to the health concerns and goals most important to you. Complex Care Management services are voluntary, and the patient may decline or stop services at any time by request to their care team member.   Complex Care Management Consent Status: Patient son did not agree to participate in complex care management services at this time.  Follow up plan:  None Encounter Outcome:  patient son declined   Harlene Satterfield  [facility]  [facility] Guide  Direct Dial: [phone]  Fax [phone]

## 2024-02-27 ENCOUNTER — Other Ambulatory Visit: Payer: Self-pay | Admitting: Internal Medicine
